# Patient Record
Sex: Male | Born: 1945 | Race: White | Hispanic: No | Marital: Married | Smoking: Former smoker
Health system: Southern US, Community
[De-identification: ages and names within clinical notes are randomized; demographics above are authoritative.]

## PROBLEM LIST (undated history)

## (undated) DIAGNOSIS — M1990 Unspecified osteoarthritis, unspecified site: Secondary | ICD-10-CM

## (undated) DIAGNOSIS — I2510 Atherosclerotic heart disease of native coronary artery without angina pectoris: Secondary | ICD-10-CM

## (undated) DIAGNOSIS — I10 Essential (primary) hypertension: Secondary | ICD-10-CM

## (undated) DIAGNOSIS — T7840XA Allergy, unspecified, initial encounter: Secondary | ICD-10-CM

## (undated) DIAGNOSIS — Z8719 Personal history of other diseases of the digestive system: Secondary | ICD-10-CM

## (undated) DIAGNOSIS — Z789 Other specified health status: Secondary | ICD-10-CM

## (undated) HISTORY — DX: Allergy, unspecified, initial encounter: T78.40XA

## (undated) HISTORY — PX: COLONOSCOPY: SHX174

## (undated) HISTORY — PX: KNEE ARTHROSCOPY: SHX127

## (undated) HISTORY — PX: TONSILLECTOMY: SUR1361

## (undated) HISTORY — PX: KNEE ARTHROPLASTY: SHX992

---

## 2002-10-05 ENCOUNTER — Encounter (HOSPITAL_COMMUNITY): Admission: RE | Admit: 2002-10-05 | Discharge: 2003-01-03 | Payer: Self-pay | Admitting: Otolaryngology

## 2002-10-13 ENCOUNTER — Encounter: Admission: RE | Admit: 2002-10-13 | Discharge: 2002-10-13 | Payer: Self-pay | Admitting: Otolaryngology

## 2002-10-13 ENCOUNTER — Encounter: Payer: Self-pay | Admitting: Otolaryngology

## 2002-10-16 ENCOUNTER — Observation Stay (HOSPITAL_COMMUNITY): Admission: EM | Admit: 2002-10-16 | Discharge: 2002-10-17 | Payer: Self-pay | Admitting: Emergency Medicine

## 2002-10-16 ENCOUNTER — Encounter: Payer: Self-pay | Admitting: Emergency Medicine

## 2002-10-29 ENCOUNTER — Ambulatory Visit (HOSPITAL_COMMUNITY): Admission: RE | Admit: 2002-10-29 | Discharge: 2002-10-29 | Payer: Self-pay | Admitting: Cardiology

## 2006-05-28 HISTORY — PX: JOINT REPLACEMENT: SHX530

## 2006-12-16 ENCOUNTER — Inpatient Hospital Stay (HOSPITAL_COMMUNITY): Admission: RE | Admit: 2006-12-16 | Discharge: 2006-12-19 | Payer: Self-pay | Admitting: Orthopedic Surgery

## 2007-03-10 ENCOUNTER — Encounter: Admission: RE | Admit: 2007-03-10 | Discharge: 2007-04-08 | Payer: Self-pay | Admitting: Family Medicine

## 2007-06-11 ENCOUNTER — Inpatient Hospital Stay (HOSPITAL_COMMUNITY): Admission: AD | Admit: 2007-06-11 | Discharge: 2007-06-13 | Payer: Self-pay | Admitting: Gastroenterology

## 2008-05-28 HISTORY — PX: JOINT REPLACEMENT: SHX530

## 2008-12-15 ENCOUNTER — Inpatient Hospital Stay (HOSPITAL_COMMUNITY): Admission: RE | Admit: 2008-12-15 | Discharge: 2008-12-17 | Payer: Self-pay | Admitting: Orthopedic Surgery

## 2009-08-26 HISTORY — PX: ORIF FINGER FRACTURE: SHX2122

## 2009-09-05 ENCOUNTER — Ambulatory Visit (HOSPITAL_BASED_OUTPATIENT_CLINIC_OR_DEPARTMENT_OTHER): Admission: RE | Admit: 2009-09-05 | Discharge: 2009-09-05 | Payer: Self-pay | Admitting: Orthopedic Surgery

## 2010-07-06 ENCOUNTER — Other Ambulatory Visit: Payer: Self-pay | Admitting: Otolaryngology

## 2010-07-10 ENCOUNTER — Ambulatory Visit
Admission: RE | Admit: 2010-07-10 | Discharge: 2010-07-10 | Disposition: A | Discharge status: Home / Self Care | Payer: 59 | Source: Ambulatory Visit | Attending: Otolaryngology | Admitting: Otolaryngology

## 2010-07-10 MED ORDER — IOHEXOL 300 MG/ML  SOLN
75.0000 mL | Freq: Once | INTRAMUSCULAR | Status: AC | PRN
  Administered 2010-07-10: 75 mL via INTRAVENOUS

## 2010-09-03 LAB — COMPREHENSIVE METABOLIC PANEL
Albumin: 4.4 g/dL (ref 3.5–5.2)
BUN: 17 mg/dL (ref 6–23)
Chloride: 103 mEq/L (ref 96–112)
Creatinine, Ser: 1.05 mg/dL (ref 0.4–1.5)
GFR calc non Af Amer: >60 mL/min (ref >60)
Total Bilirubin: 1 mg/dL (ref 0.3–1.2)

## 2010-09-03 LAB — BASIC METABOLIC PANEL
CO2: 27 mEq/L (ref 19–32)
Chloride: 102 mEq/L (ref 96–112)
GFR calc Af Amer: >60 mL/min (ref >60)
GFR calc non Af Amer: >60 mL/min (ref >60)
Glucose, Bld: 136 mg/dL — ABNORMAL HIGH (ref 70–99)
Potassium: 3.7 mEq/L (ref 3.5–5.1)
Potassium: 4.3 mEq/L (ref 3.5–5.1)
Sodium: 134 mEq/L — ABNORMAL LOW (ref 135–145)

## 2010-09-03 LAB — CBC
HCT: 26.2 % — ABNORMAL LOW (ref 39.0–52.0)
HCT: 31.8 % — ABNORMAL LOW (ref 39.0–52.0)
HCT: 41 % (ref 39.0–52.0)
Hemoglobin: 10.9 g/dL — ABNORMAL LOW (ref 13.0–17.0)
Hemoglobin: 9.2 g/dL — ABNORMAL LOW (ref 13.0–17.0)
MCHC: 35.2 g/dL (ref 30.0–36.0)
MCV: 98.5 fL (ref 78.0–100.0)
MCV: 97.1 fL (ref 78.0–100.0)
Platelets: 210 10*3/uL (ref 150–400)
RBC: 3.23 MIL/uL — ABNORMAL LOW (ref 4.22–5.81)
RDW: 13.4 % (ref 11.5–15.5)
WBC: 7.9 10*3/uL (ref 4.0–10.5)
WBC: 6.8 10*3/uL (ref 4.0–10.5)

## 2010-09-03 LAB — APTT: aPTT: 33 seconds (ref 24–37)

## 2010-09-03 LAB — URINALYSIS, ROUTINE W REFLEX MICROSCOPIC
Bilirubin Urine: NEGATIVE
Ketones, ur: NEGATIVE mg/dL
Nitrite: NEGATIVE
Protein, ur: NEGATIVE mg/dL
Specific Gravity, Urine: 1.022 (ref 1.005–1.030)
Urobilinogen, UA: 0.2 mg/dL (ref 0.0–1.0)

## 2010-09-03 LAB — TYPE AND SCREEN

## 2010-09-03 LAB — PROTIME-INR
INR: 1 (ref 0.00–1.49)
Prothrombin Time: 13.4 seconds (ref 11.6–15.2)

## 2010-09-26 HISTORY — PX: CARDIAC CATHETERIZATION: SHX172

## 2010-10-10 NOTE — Op Note (Signed)
NAMEDAN, SCEARCE NO.:  [id number]   MEDICAL RECORD NO.:  [id number]          PATIENT TYPE:  INP   LOCATION:  5009                         FACILITY:  [facility]   PHYSICIAN:  Loreta Ave, M.D. DATE OF BIRTH:  1945/06/03   DATE OF PROCEDURE:  DATE OF DISCHARGE:                               OPERATIVE REPORT   PREOPERATIVE DIAGNOSIS:  End-stage degenerative arthritis, left knee,  varus alignment; mild flexion contracture.   POSTOPERATIVE DIAGNOSIS:  End-stage degenerative arthritis, left knee,  varus alignment; mild flexion contracture.   PROCEDURE:  Left modified minimally invasive total knee replacement with  Stryker triathlon prosthesis.  Cemented pegged posterior stabilized #6  femoral component.  Cemented #7 tibial component with a 9-mm  polyethylene insert.  Resurfacing cemented, pegged medial offset 38-mm  patellar component.  Soft tissue balancing.   SURGEON:  Loreta Ave, MD   ASSISTANT:  Genene Churn. Barry Dienes, [place], present throughout the entire case, was  necessary for timely completion of the procedure.   ANESTHESIA:  General.   BLOOD LOSS:  Minimal.   SPECIMENS:  None.   CULTURES:  None.   COMPLICATIONS:  None.   DRESSING:  Sterile compressive knee immobilizer.   TOURNIQUET TIME:  1 hour and 15 minutes.   DRAIN:  Hemovac x1.   PROCEDURE IN DETAIL:  The patient was brought to the operating room,  placed on operating table in supine position.  After adequate anesthesia  had been obtained, knee examined.  Mild flexion contracture, varus  alignment of more than 5 degrees, not very correctable.  Stable  ligaments.  Good flexion.  Tourniquet applied.  Prepped and draped in  usual sterile fashion.  Exsanguinated with elevation of Esmarch.  Tourniquet inflated to 350 mmHg.  Straight incision above the patella  down the tibial tubercle.  Medial arthrotomy, vastus splitting,  preserving quad tendon.  Knee exposed.  Marked grade 4 changes  throughout.  Remnants of menisci, cruciate ligaments removed.  Numerous  small loose bodies and eventually one large 2-cm loose body removed.  Distal femur exposed.  Intramedullary guide placed.  Distal cut 10-mm,  75 degrees of valgus.  Using epicondylar axis, size cut and fitted for a  peg, posterior stabilized, #6 component.  Proximal tibia exposed.  3-  degree posterior slope cut, extramedullary guide.  Size #7 component.  Medial capsule release.  All recess examined.  All loose bodies, all  spurs removed.  Thoroughly irrigated.  Patella exposed, measured, cut,  drilled, and fitted with a 38-mm component.  Trials put in place  throughout, #6 on the femur, #7 on the tibia.  With a 9-mm insert, full  extension and full flexion.  Good alignment and good stability and good  patellofemoral tracking.  Tibia was marked for appropriate rotation and  reamed.  All trials removed.  Copious irrigation with a Pulse irrigating  device.  Cement prepared, placed on all components, which were firmly  seated.  Knee reduced.  Patellar component held in place with a clamp.  Once the cement hardened, reexamined.  Pleased with alignment,  stability, flexion, extension, and patellofemoral tracking.  Hemovac  placed and brought out through a separate stab wound.  Arthrotomy closed  with #1 Vicryl.  Skin and subcutaneous tissue with Vicryl and staples.  Knee injected with Marcaine.  Hemovac clamp.  Sterile compressive  dressing applied.  Tourniquet deflated and removed.  Knee immobilizer  applied.  Anesthesia reversed.  Brought to the recovery room.  He  tolerated the surgery well.  No complications.      Loreta Ave, M.D.  Electronically Signed     DFM/MEDQ  D:  12/15/2008  T:  12/16/2008  Job:  [id number]

## 2010-10-10 NOTE — Op Note (Signed)
NAMEDAMACIO, WEISGERBER NO.:  [id number]   MEDICAL RECORD NO.:  [id number]          PATIENT TYPE:  INP   LOCATION:  2550                         FACILITY:  [facility]   PHYSICIAN:  Loreta Ave, M.D. DATE OF BIRTH:  30-Nov-1945   DATE OF PROCEDURE:  12/16/2006  DATE OF DISCHARGE:                               OPERATIVE REPORT   PREOPERATIVE DIAGNOSES:  1. End-stage degenerative arthritis, right knee.  2. Varus alignment and flexion contracture.   POSTOPERATIVE DIAGNOSES:  1. End-stage degenerative arthritis, right knee.  2. Varus alignment and flexion contracture.   PROCEDURE:  Right knee total knee replacement utilizing Stryker  triathlon prosthesis.  Minimally invasive system.  Cemented pegged  posterior stabilized #6 femoral component.  Cemented #7 tibial component  with 13-mm posterior stabilized polyethylene insert.  Resurfacing 38 mm  x 10-mm patellar component cemented pegged medial offset.  Soft tissue  balancing and medial capsule release.   SURGEON:  Loreta Ave, M.D.   ASSISTANT:  Zonia Kief, PA present throughout the entire case.   ANESTHESIA:  General.   BLOOD LOSS:  Minimal.   SPECIMENS:  None.   CULTURES:  None.   COMPLICATIONS:  None.   DRESSING:  Soft compressive with knee immobilizer.   DRAIN:  Hemovac x1.   TOURNIQUET TIME:  1 hour and 20 minutes.   PROCEDURE:  The patient was brought to the operating room and placed on  the operating table in supine position.  After adequate anesthesia had  been obtained, the right knee examined.  A couple degree flexion  contracture, further flexion better than 100 degrees.  Varus alignment  correctable to neutral.  Tourniquet applied, prepped and draped in the  usual sterile fashion.  Exsanguinated with elevation of Esmarch,  tourniquet inflated to 350 mmHg.  A straight incision above the patella  down to the tibial tubercle.  Medial arthrotomy up to the superomedial  aspect of the  patella.  Vastus splitting from there for a minimally  invasive system.  Knee exposed.  Grade 4 changes throughout.  Periarticular spurs, remnants of menisci and cruciate ligaments, loose  bodies, hypertrophic synovial tissue debrided.  Distal femur exposed.  Intramedullary guide placed. Distal cut removing 10 mm set at 5 degrees  of valgus to mechanical axis.  Epicondylar axis marked.  Utilizing that  the femur was sized, cut and fitted for a posterior stabilized #6  component.  Excellent capture and alignment and fixation.  Attention  turned to the tibia.  Extramedullary guide.  Sufficient resection to get  a good perpendicular cut.  Some bony deficiency medially which brought  the cut down a little bit.  After the 3 degree posterior slope cut was  made, the size #7 component.  Trials put in place above and below.  A #6  on the femur, #7 on the tibia.  With the 13-mm insert, I had a nicely  balanced knee with full extension, full flexion, good alignment and good  stability.  Marked for rotation and the tibia was hand reamed.  The  patella prepared with a 10-mm posterior resection.  Revised to  11 mm  when I chose a 38-mm component.  predrilled and fitted with a 38-mm  component.  Full motion, good tracking patellofemoral joint. All trials  removed.  Copious irrigation with a pulse irrigating device.  Cement  prepared and placed on all components which were firmly seated.  Polyethylene attached to tibia.  The knee reduced.  Once the cement  hardened, it was reexamined.  Full extension, full flexion, good  alignment, good stability and good patellofemoral tracking.  Hemovac  placed and brought out through a separate stab wound.  Arthrotomy closed  with #1 Vicryl, skin and subcutaneous tissue with Vicryl and staples.  Knee injected with Marcaine.  Hemovac clamped.  Sterile compressive  dressing applied.  The tourniquet inflated and removed.  Knee  immobilizer applied.  Anesthesia reversed.   Brought to the recovery  room. He tolerated surgery well with no complications.      Loreta Ave, M.D.  Electronically Signed     DFM/MEDQ  D:  12/16/2006  T:  12/16/2006  Job:  [id number]

## 2010-10-13 NOTE — Discharge Summary (Signed)
NAME:  Kenneth Wagner, Kenneth Wagner                      ACCOUNT NO.:  [id number]   MEDICAL RECORD NO.:  [id number]                   PATIENT TYPE:  OBV   LOCATION:  5784                                 FACILITY:  [facility]   PHYSICIAN:  Jackie Plum, M.D.             DATE OF BIRTH:  [birth year]   DATE OF ADMISSION:  10/16/2002  DATE OF DISCHARGE:  10/17/2002                                 DISCHARGE SUMMARY   PRIMARY CARE PHYSICIAN:  Tesfaye D. Felecia Shelling, M.D., fax number [phone].   DISCHARGE DIAGNOSES:  1. Chest pain.  2. History of recent hearing loss.  3. Elevated liver enzymes.   DISCHARGE MEDICATIONS:  1. Aspirin 325 mg daily.  2. Lopressor 25 mg one-half tablet twice daily.   CONSULTATIONS:  None this hospitalization.   PROCEDURES AND STUDIES:  A 12-lead EKG, May 21, with normal sinus rhythm.   LABORATORY DATA:  Sodium 136, potassium 4.1, chloride 104, CO2 26, glucose  97, BUN 23, creatinine 1.1, calcium 8.2.  Total protein 64, albumin 3.9.  AST 43, ALT 50, alkaline phosphatase 98.  WBC 7.3, RBC 4.5, hemoglobin 14.5,  hematocrit 42.8.  CK 66, CK-MB 1.5, troponin 0.02, then 0.01.  Total  cholesterol 182, triglycerides 99, HDL 72, LDL 90.   DISPOSITION:  The patient was discharged home.   CONDITION ON DISCHARGE:  Stable.   HISTORY OF PRESENT ILLNESS:  This is a 65 year old man who presented to  [facility] Emergency Room on the morning of admission with complaints of  chest pain.  The patient said he awoke with chest pain that was described as  pressure-like in quality.  He has reported that it lasted approximately 15  minutes and was associated with nausea and diaphoresis.  He described the  pain as 3/10 as 3/10 in intensity; however, at the time of the evaluation  the pain had resolved.  The patient was concerned and came to the emergency  department because his father died of a sudden heart attack at the age of 18  and his brother has had some cardiac problems somewhere in  his late 29s.  His initial examination revealed stable vital signs.  The patient was pain  free, but due to his multiple risk factors including gender and age, he was  admitted for further evaluation and treatment.   HOSPITAL COURSE:  1. Chest Pain:  The patient was admitted to rule out MI.  Serial cardiac     enzymes were obtained along with some EKGs.  The patient had no further     complaints of pain during this hospitalization.  He was subsequently     discharged within 24 hours.  Prior to discharge, he had set up to return     on Monday morning for a treadmill stress test.   1. Recent Hearing Loss:  The patient is a Technical sales engineer .  There was no workup of     his recent hearing  loss.  This should be followed up by his primary care     physician.   1. Elevated Liver Enzymes:  Initial laboratory data reveals some slight     elevation in his AST and ALT.  No further workup was done due to the very     minimal elevation, but this should be followed up as an outpatient by his     primary care M.D.   DISCHARGE INSTRUCTIONS/FOLLOWUP:  The patient should follow up with his  primary care physician in 1-2 weeks and return in two days for the treadmill  stress test.         [name], NP                      Jackie Plum, M.D.    SJ/MEDQ  D:  10/17/2002  T:  10/17/2002  Job:  [id number]   cc:   Tesfaye D. Felecia Shelling, M.D.  [address]  [place]  [place] [address]  Fax: [phone]

## 2010-10-13 NOTE — H&P (Signed)
NAME:  Kenneth Wagner, Kenneth Wagner                      ACCOUNT NO.:  [id number]   MEDICAL RECORD NO.:  [id number]                   PATIENT TYPE:  EMS   LOCATION:  ED                                   FACILITY:  [facility]   PHYSICIAN:  Jackie Plum, M.D.             DATE OF BIRTH:  May 15, 1946   DATE OF ADMISSION:  10/16/2002  DATE OF DISCHARGE:                                HISTORY & PHYSICAL   PROBLEM LIST:  1. Chest pain, rule out myocardial infarction.  2. History of recent hearing loss.   CHIEF COMPLAINT:  Chest pain.   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  The pain was  described as pressure like in quality and with an 18 component.  It lasted  about 15 minutes.  Was associated with nausea and diaphoresis.  It was  described as 3/10 in intensity.  However, at time of evaluation pain had  resolved.  The patient was concerned and came to ED because his father died  of a sudden heart attack at age 57 years and his brother had cardiac  problems somewhere in his late 23s.  Subsequently, brother had a stent  placed.  No history of fever, chills, cough, sputum production, ankle  swelling, cough, or leg pain.  No history of dizziness, sore throat,  abdominal pain, dysuria, or frequency of micturition.  The patient denies  any prior history of GI problems including GERD.   MEDICATIONS:  He takes aspirin at home.   ALLERGIES:  IODINE.   SOCIAL HISTORY:  He drinks alcohol about three drinks a day.  Does not smoke  cigarettes or use illicit drugs.   PHYSICAL EXAMINATION:  VITAL SIGNS:  Blood pressure 130/82, pulse 73,  respiratory rate 20, temperature 98.2 degrees Fahrenheit, O2 saturation 99%.  GENERAL:  He was not in acute cardiopulmonary distress.  HEENT:  Not pale.  Not icteric.  NECK:  No JVD.  LUNGS:  Clear to auscultation.  CARDIAC:  Regular rate and rhythm without any gallops or  murmur.  ABDOMEN:  Unremarkable.  EXTREMITIES:  No cyanosis.  No edema.  CNS:  Alert and oriented x3.  No focality.   LABORATORIES:  EKG showed normal sinus rhythm at 78 beats per minute, no  obvious acute ST-T wave changes.   ASSESSMENT:  A 65 year old gentleman whose coronary disease risk factors  include his male sex and age.  He is 65 years of age.  He is concerned and  obviously anxious about this in view of prior cardiac problems in his  family.    PLAN OF CARE:  Keep patient in the hospital for observation.  Will get  cardiac panel to rule out MI.  Tomorrow is Saturday and if he rules out we  will schedule him for outpatient treadmill.  Jackie Plum, M.D.    GO/MEDQ  D:  10/16/2002  T:  10/16/2002  Job:  [id number]   cc:   [name] D. Felecia Shelling, M.D.  [address]  [place]  [place] [address]  Fax: [phone]

## 2010-10-13 NOTE — Discharge Summary (Signed)
Kenneth Wagner, SNELLGROVE NO.:  [id number]   MEDICAL RECORD NO.:  [id number]          PATIENT TYPE:  INP   LOCATION:  5010                         FACILITY:  [facility]   PHYSICIAN:  Loreta Ave, M.D. DATE OF BIRTH:  [birth year]   DATE OF ADMISSION:  12/16/2006  DATE OF DISCHARGE:  12/19/2006                               DISCHARGE SUMMARY   FINAL DIAGNOSIS:  Status post right knee replacement for end-stage  degenerative joint disease.   HISTORY OF PRESENT ILLNESS:  A 65 year old white male with a history of  end-stage DJD right knee and chronic pain who presented to our office  for preoperative evaluation for a total knee replacement. He had  progressively worsening pain with failure to respond to conservative  treatment. Significant decrease in his daily activities due to the  ongoing complaint.   HOSPITAL COURSE:  December 16, 2006, the patient was taken to the [facility]  OR and a right total knee replacement procedure performed. Surgeon was  Loreta Ave, M.D. and assistant Genene Churn. Barry Dienes, P.A.-C. Anesthesia  was general with femoral nerve block. No specimens. EBL minimal.  Tourniquet time 1 hour and 35 minutes. One Hemovac drain placed. There  were no surgical or anesthesia complications, and the patient was  transferred to the recovery room in stable condition. December 17, 2006, the  patient doing well with good pain control. Vital signs stable.  Hemoglobin 11.1, hematocrit 32.9. Sodium 135, potassium 3.7, CO2 23, CO2  28, BUN 6, creatinine 0.9, glucose 127. Dressing clean, dry and intact.  Neurovascularly intact. Calf nontender. Pharmacy protocol Coumadin  started. December 18, 2006, the patient doing well and progressing great  with PT. No specific complaints. Vital signs stable, afebrile.  Hemoglobin 10.8, hematocrit 31.4. Glucose 135. Wound looked good,  staples intact. Hemovac drain pulled. Calf nontender. Neurovascularly  intact. No signs of injection.  Discontinued PCA, Foley and O2. Saline  locked IV. December 19, 2006, the patient still doing well with therapy and  pain control. States that he is ready to go home. Temperature 100.9,  pulse 76, respirations 20, blood pressure 118/72. WBC 8.9, hematocrit  32.0, hemoglobin 10.7, platelets 179. Sodium 137, potassium 4.8,  chloride 98, CO2 32, BUN 4, creatinine 1.08, glucose 124. Wound looks  good. Staples intact. Calf nontender. Neurovascularly intact. No  drainage or signs of infection.   DISPOSITION:  Discharged home.   CONDITION:  Good and stable.   MEDICATIONS:  1. Percocet 7.5/325 one to two tablets p.o. q.4-6 hours p.r.n. for      pain.  2. Robaxin 500 mg 1 tablet p.o. q.6 hours p.r.n. for spasms.  3. Lovenox 30 mg 1 subcutaneous injection daily for 4 weeks      postoperatively.   INSTRUCTIONS:  The patient to work with home health PT and OT to improve  knee range of motion and strengthening and ambulation. Daily dressing  changes of 4 x 4 gauze and tape. He remain on Lovenox for 4 weeks  postoperatively for DVT prophylaxis. He will follow up in the office 2  weeks postoperatively for recheck and have possible  staple removal.  Return __________      Genene Churn. Denton Meek.      Loreta Ave, M.D.  Electronically Signed    JMO/MEDQ  D:  02/12/2007  T:  02/12/2007  Job:  [id number]

## 2010-10-13 NOTE — Cardiovascular Report (Signed)
NAME:  Kenneth Wagner, Kenneth Wagner                      ACCOUNT NO.:  [id number]   MEDICAL RECORD NO.:  [id number]                   PATIENT TYPE:  OIB   LOCATION:  2899                                 FACILITY:  [facility]   PHYSICIAN:  W. Ashley Royalty., M.D.         DATE OF BIRTH:  04/29/1946   DATE OF PROCEDURE:  DATE OF DISCHARGE:                              CARDIAC CATHETERIZATION   HISTORY:  A 65 year old male who has a prior family history of heart  disease.  He had difficulty with hearing loss in his left ear and had  recently undergone a treatment course of Renografin and Dextran as well as  prednisone.  He had a prolonged episode of chest discomfort lasting 15  minutes.  It sounded ischemic and has subsequently had an abnormal treadmill  test.   COMMENTS ABOUT THE PROCEDURE:  The patient tolerated the procedure well  without complications.  The procedure was done through 6-French catheters  through the right femoral artery percutaneously and at the end of the  procedure good hemostasis and peripheral pulses were present.   HEMODYNAMIC DATA:  1. Aorta post contrast 115/66.  2. LV post contrast 115/4-12.   ANGIOGRAPHIC DATA:  LEFT VENTRICULOGRAM:  Performed in the 30 degree RAO  projection.  The aortic valve was normal.  The mitral valve was normal.  The left ventricle appears normal in size.  The estimated ejection fraction is 65%.  There are no wall motion abnormalities.  Coronary arteries arise and distribute normally.  There is mild calcification noted in the proximal left anterior descending  system.  Left main coronary artery is normal.  Left anterior descending:  A large vessel which extends to the apex.  There  is mild calcification noted, but only scattered irregularities are noted  with no significant focal obstructive stenoses noted.  Circumflex coronary artery:  Intermediate branch arises, but has a mild 20%  stenosis at its ostium.  It gives off a first  marginal branch and is a  dominant vessel with a posterolateral branch and ends in the posterior  descending artery.  It is free of disease otherwise.  Right coronary artery is a small, nondominant vessel supplying mainly right  ventricular branches and appears free of disease.   IMPRESSION:  1. Very mild coronary artery disease with calcification noted in the     proximal left anterior descending and mild     irregularities in the vessel.  2. Normal left ventricular function.   RECOMMENDATIONS:  Reassurance at this time.  Continue risk factor  modification.                                               Darden Palmer., M.D.    WST/MEDQ  D:  10/29/2002  T:  10/29/2002  Job:  [id number]  cc:   Al Decant. Janey Greaser, M.D.  [address]  [place]  [place] [address]  Fax: [phone]

## 2010-10-13 NOTE — Discharge Summary (Signed)
NAMEMEGHAN, TIEMANN NO.:  [id number]   MEDICAL RECORD NO.:  [id number]          PATIENT TYPE:  INP   LOCATION:  1513                         FACILITY:  [facility]   PHYSICIAN:  James L. Malon Kindle., M.D.DATE OF BIRTH:  07/21/1945   DATE OF ADMISSION:  06/10/2007  DATE OF DISCHARGE:  06/13/2007                               DISCHARGE SUMMARY   ADMISSION DIAGNOSIS:  Post polypectomy bleed.   FINAL DIAGNOSIS:  Post polypectomy bleed.   PERTINENT HISTORY:  A 65 year old who had colonoscopy performed five  days prior to admission, several polyps removed.  He continued to take  aspirin despite being told to stop it, came in with bright red blood,  hemoglobin had dropped to 6.6.   HOSPITAL COURSE:  The patient was admitted, given several units of  blood.  He received a total of 3 units over his hospitalization.  His  hemoglobin gradually come up to 9.5 and he was stable.  His stools  became brown.  He was felt to be in satisfactory condition for  discharge.   DISPOSITION:  Patient is discharged home.  Will follow up in the office  with Dr. Randa Evens for a CBC in the next several days.           ______________________________  Llana Aliment Malon Kindle., M.D.     Waldron Session  D:  06/30/2007  T:  07/01/2007  Job:  [id number]

## 2011-02-14 LAB — CBC
HCT: 23 — ABNORMAL LOW
MCHC: 35.6
MCV: 92.3
Platelets: 149 — ABNORMAL LOW
RBC: 2.49 — ABNORMAL LOW

## 2011-02-14 LAB — COMPREHENSIVE METABOLIC PANEL
AST: 18
BUN: 8
CO2: 29
Calcium: 7.9 — ABNORMAL LOW
Creatinine, Ser: 0.96
GFR calc Af Amer: >60
GFR calc non Af Amer: >60
Total Bilirubin: 0.6

## 2011-02-14 LAB — CROSSMATCH

## 2011-02-14 LAB — PROTIME-INR: INR: 1.1

## 2011-02-14 LAB — APTT: aPTT: 30

## 2011-02-14 LAB — HEMOGLOBIN AND HEMATOCRIT, BLOOD
HCT: 18.8 — ABNORMAL LOW
HCT: 24.9 — ABNORMAL LOW
Hemoglobin: 8.7 — ABNORMAL LOW

## 2011-02-14 LAB — ABO/RH: ABO/RH(D): O POS

## 2011-02-15 LAB — HEMOGLOBIN AND HEMATOCRIT, BLOOD
HCT: 24.9 — ABNORMAL LOW
HCT: 26.5 — ABNORMAL LOW
HCT: 27.4 — ABNORMAL LOW
Hemoglobin: 8.7 — ABNORMAL LOW
Hemoglobin: 9.5 — ABNORMAL LOW

## 2011-03-12 LAB — CBC
HCT: 41
Hemoglobin: 10.7 — ABNORMAL LOW
Hemoglobin: 11.1 — ABNORMAL LOW
Hemoglobin: 13.9
MCHC: 33.5
MCV: 97.3
MCV: 97.5
Platelets: 149 — ABNORMAL LOW
Platelets: 179
RDW: 13.4
RDW: 13.8
RDW: 14.2 — ABNORMAL HIGH
WBC: 8.8

## 2011-03-12 LAB — BASIC METABOLIC PANEL
BUN: 4 — ABNORMAL LOW
BUN: 4 — ABNORMAL LOW
BUN: 6
CO2: 28
Calcium: 8 — ABNORMAL LOW
Calcium: 8.7
Chloride: 100
Creatinine, Ser: 0.86
Creatinine, Ser: 0.9
GFR calc non Af Amer: >60
Glucose, Bld: 124 — ABNORMAL HIGH
Glucose, Bld: 127 — ABNORMAL HIGH
Sodium: 137

## 2011-03-12 LAB — URINALYSIS, ROUTINE W REFLEX MICROSCOPIC
Bilirubin Urine: NEGATIVE
Hgb urine dipstick: NEGATIVE
Ketones, ur: NEGATIVE
Protein, ur: NEGATIVE
Urobilinogen, UA: 1

## 2011-03-12 LAB — COMPREHENSIVE METABOLIC PANEL
Alkaline Phosphatase: 46
BUN: 13
Chloride: 104
Creatinine, Ser: 1.05
Glucose, Bld: 81
Potassium: 4
Total Bilirubin: 1.1

## 2011-03-12 LAB — PROTIME-INR
INR: 1
INR: 1.1
INR: 1.2
Prothrombin Time: 13.6
Prothrombin Time: 14.7
Prothrombin Time: 15
Prothrombin Time: 15.7 — ABNORMAL HIGH

## 2011-03-12 LAB — ABO/RH: ABO/RH(D): O POS

## 2011-08-07 ENCOUNTER — Encounter (HOSPITAL_BASED_OUTPATIENT_CLINIC_OR_DEPARTMENT_OTHER): Payer: Self-pay | Admitting: *Deleted

## 2011-08-07 ENCOUNTER — Other Ambulatory Visit: Payer: Self-pay | Admitting: Orthopedic Surgery

## 2011-08-07 NOTE — Progress Notes (Signed)
No labs needed

## 2011-08-13 ENCOUNTER — Encounter (HOSPITAL_BASED_OUTPATIENT_CLINIC_OR_DEPARTMENT_OTHER)
Admission: RE | Disposition: A | Discharge status: Home / Self Care | Payer: Self-pay | Source: Ambulatory Visit | Attending: Orthopedic Surgery

## 2011-08-13 ENCOUNTER — Encounter (HOSPITAL_BASED_OUTPATIENT_CLINIC_OR_DEPARTMENT_OTHER): Payer: Self-pay | Admitting: Orthopedic Surgery

## 2011-08-13 ENCOUNTER — Ambulatory Visit (HOSPITAL_BASED_OUTPATIENT_CLINIC_OR_DEPARTMENT_OTHER)
Admission: RE | Admit: 2011-08-13 | Discharge: 2011-08-13 | Disposition: A | Discharge status: Home / Self Care | Payer: 59 | Source: Ambulatory Visit | Attending: Orthopedic Surgery | Admitting: Orthopedic Surgery

## 2011-08-13 ENCOUNTER — Encounter (HOSPITAL_BASED_OUTPATIENT_CLINIC_OR_DEPARTMENT_OTHER): Payer: Self-pay | Admitting: Anesthesiology

## 2011-08-13 ENCOUNTER — Ambulatory Visit (HOSPITAL_BASED_OUTPATIENT_CLINIC_OR_DEPARTMENT_OTHER): Payer: 59 | Admitting: Anesthesiology

## 2011-08-13 DIAGNOSIS — S60459A Superficial foreign body of unspecified finger, initial encounter: Secondary | ICD-10-CM | POA: Insufficient documentation

## 2011-08-13 DIAGNOSIS — G5600 Carpal tunnel syndrome, unspecified upper limb: Secondary | ICD-10-CM | POA: Insufficient documentation

## 2011-08-13 HISTORY — DX: Other specified health status: Z78.9

## 2011-08-13 HISTORY — PX: FOREIGN BODY REMOVAL: SHX962

## 2011-08-13 HISTORY — DX: Personal history of other diseases of the digestive system: Z87.19

## 2011-08-13 HISTORY — PX: CARPAL TUNNEL RELEASE: SHX101

## 2011-08-13 HISTORY — DX: Unspecified osteoarthritis, unspecified site: M19.90

## 2011-08-13 SURGERY — CARPAL TUNNEL RELEASE
Anesthesia: General | Site: Hand | Laterality: Left | Wound class: Clean

## 2011-08-13 MED ORDER — MIDAZOLAM HCL 5 MG/5ML IJ SOLN
INTRAMUSCULAR | Status: DC | PRN
  Administered 2011-08-13: 2 mg via INTRAVENOUS

## 2011-08-13 MED ORDER — LIDOCAINE HCL (CARDIAC) 20 MG/ML IV SOLN
INTRAVENOUS | Status: DC | PRN
  Administered 2011-08-13: 80 mg via INTRAVENOUS

## 2011-08-13 MED ORDER — CHLORHEXIDINE GLUCONATE 4 % EX LIQD: 60.0000 mL | Freq: Once | CUTANEOUS | Status: DC

## 2011-08-13 MED ORDER — HYDROCODONE-ACETAMINOPHEN 5-500 MG PO TABS: 1.0000 | ORAL_TABLET | ORAL | Status: AC | PRN

## 2011-08-13 MED ORDER — LORAZEPAM 2 MG/ML IJ SOLN: 1.0000 mg | Freq: Once | INTRAMUSCULAR | Status: DC | PRN

## 2011-08-13 MED ORDER — BUPIVACAINE HCL (PF) 0.25 % IJ SOLN
INTRAMUSCULAR | Status: DC | PRN
  Administered 2011-08-13: 9 mL

## 2011-08-13 MED ORDER — HYDROMORPHONE HCL PF 1 MG/ML IJ SOLN: 0.2500 mg | INTRAMUSCULAR | Status: DC | PRN

## 2011-08-13 MED ORDER — MIDAZOLAM HCL 2 MG/2ML IJ SOLN: 1.0000 mg | INTRAMUSCULAR | Status: DC | PRN

## 2011-08-13 MED ORDER — FENTANYL CITRATE 0.05 MG/ML IJ SOLN: 50.0000 ug | INTRAMUSCULAR | Status: DC | PRN

## 2011-08-13 MED ORDER — LACTATED RINGERS IV SOLN
INTRAVENOUS | Status: DC
  Administered 2011-08-13: 12:00:00 via INTRAVENOUS

## 2011-08-13 MED ORDER — ONDANSETRON HCL 4 MG/2ML IJ SOLN
INTRAMUSCULAR | Status: DC | PRN
  Administered 2011-08-13: 4 mg via INTRAVENOUS

## 2011-08-13 MED ORDER — FENTANYL CITRATE 0.05 MG/ML IJ SOLN
INTRAMUSCULAR | Status: DC | PRN
  Administered 2011-08-13 (×2): 50 ug via INTRAVENOUS

## 2011-08-13 MED ORDER — DEXAMETHASONE SODIUM PHOSPHATE 10 MG/ML IJ SOLN
INTRAMUSCULAR | Status: DC | PRN
  Administered 2011-08-13: 10 mg via INTRAVENOUS

## 2011-08-13 MED ORDER — LACTATED RINGERS IV SOLN
INTRAVENOUS | Status: DC | PRN
  Administered 2011-08-13 (×2): via INTRAVENOUS

## 2011-08-13 MED ORDER — CEFAZOLIN SODIUM 1-5 GM-% IV SOLN: 1.0000 g | INTRAVENOUS | Status: DC

## 2011-08-13 MED ORDER — PROPOFOL 10 MG/ML IV EMUL
INTRAVENOUS | Status: DC | PRN
  Administered 2011-08-13: 250 mg via INTRAVENOUS

## 2011-08-13 SURGICAL SUPPLY — 42 items
BANDAGE GAUZE ELAST BULKY 4 IN (GAUZE/BANDAGES/DRESSINGS) ×2 IMPLANT
BLADE MINI RND TIP GREEN BEAV (BLADE) IMPLANT
BLADE SURG 15 STRL LF DISP TIS (BLADE) ×1 IMPLANT
BLADE SURG 15 STRL SS (BLADE) ×1
BNDG COHESIVE 1X5 TAN STRL LF (GAUZE/BANDAGES/DRESSINGS) IMPLANT
BNDG COHESIVE 3X5 TAN STRL LF (GAUZE/BANDAGES/DRESSINGS) ×2 IMPLANT
BNDG ESMARK 4X9 LF (GAUZE/BANDAGES/DRESSINGS) ×2 IMPLANT
CHLORAPREP W/TINT 26ML (MISCELLANEOUS) ×2 IMPLANT
CLOTH BEACON ORANGE TIMEOUT ST (SAFETY) ×2 IMPLANT
CORDS BIPOLAR (ELECTRODE) ×2 IMPLANT
COVER MAYO STAND STRL (DRAPES) ×2 IMPLANT
COVER TABLE BACK 60X90 (DRAPES) ×2 IMPLANT
CUFF TOURNIQUET SINGLE 18IN (TOURNIQUET CUFF) ×2 IMPLANT
DRAPE EXTREMITY T 121X128X90 (DRAPE) ×2 IMPLANT
DRAPE SURG 17X23 STRL (DRAPES) ×2 IMPLANT
DRSG KUZMA FLUFF (GAUZE/BANDAGES/DRESSINGS) ×2 IMPLANT
GAUZE XEROFORM 1X8 LF (GAUZE/BANDAGES/DRESSINGS) ×2 IMPLANT
GLOVE BIO SURGEON STRL SZ 6.5 (GLOVE) IMPLANT
GLOVE BIOGEL PI IND STRL 8 (GLOVE) ×1 IMPLANT
GLOVE BIOGEL PI INDICATOR 8 (GLOVE) ×1
GLOVE SURG ORTHO 8.0 STRL STRW (GLOVE) ×2 IMPLANT
GLOVE SURG SS PI 8.0 STRL IVOR (GLOVE) ×2 IMPLANT
GOWN BRE IMP PREV XXLGXLNG (GOWN DISPOSABLE) ×4 IMPLANT
GOWN PREVENTION PLUS XLARGE (GOWN DISPOSABLE) IMPLANT
NEEDLE 27GAX1X1/2 (NEEDLE) ×2 IMPLANT
NS IRRIG 1000ML POUR BTL (IV SOLUTION) ×2 IMPLANT
PACK BASIN DAY SURGERY FS (CUSTOM PROCEDURE TRAY) ×2 IMPLANT
PAD CAST 3X4 CTTN HI CHSV (CAST SUPPLIES) ×1 IMPLANT
PADDING CAST ABS 4INX4YD NS (CAST SUPPLIES)
PADDING CAST ABS COTTON 4X4 ST (CAST SUPPLIES) IMPLANT
PADDING CAST COTTON 3X4 STRL (CAST SUPPLIES) ×1
SPONGE GAUZE 4X4 12PLY (GAUZE/BANDAGES/DRESSINGS) ×2 IMPLANT
STOCKINETTE 4X48 STRL (DRAPES) ×2 IMPLANT
SUT VIC AB 4-0 P2 18 (SUTURE) IMPLANT
SUT VICRYL 4-0 PS2 18IN ABS (SUTURE) IMPLANT
SUT VICRYL RAPID 5 0 P 3 (SUTURE) IMPLANT
SUT VICRYL RAPIDE 4/0 PS 2 (SUTURE) ×4 IMPLANT
SYR BULB 3OZ (MISCELLANEOUS) ×2 IMPLANT
SYR CONTROL 10ML LL (SYRINGE) ×2 IMPLANT
TOWEL OR 17X24 6PK STRL BLUE (TOWEL DISPOSABLE) ×4 IMPLANT
UNDERPAD 30X30 INCONTINENT (UNDERPADS AND DIAPERS) ×2 IMPLANT
WATER STERILE IRR 1000ML POUR (IV SOLUTION) IMPLANT
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
## 2011-08-13 NOTE — Discharge Instructions (Addendum)
[facility] Instructions Hand Surgery  Wound Care: Keep your hand elevated above the level of your heart.  Do not allow it to dangle  by your side.  Keep the dressing dry and do not remove it unless your doctor advises you to do so.  He will usually change it at the time of your post-op visit.  Moving your fingers is advised to stimulate circulation but will depend on the site of your surgery.  If you have a splint applied, your doctor will advise you regarding movement.  Activity: Do not drive or operate machinery today.  Rest today and then you may return to your normal activity and work as indicated by your physician.  Diet:  Drink liquids today or eat a light diet.  You may resume a regular diet tomorrow.    General expectations: Pain for two to three days. Fingers may become slightly swollen.  Call your doctor if any of the following occur: Severe pain not relieved by pain medication. Elevated temperature. Dressing soaked with blood. Inability to move fingers. White or bluish color to fingers.[facility]  [address] [place], [place] [address] [phone]   Post Anesthesia Home Care Instructions  Activity: Get plenty of rest for the remainder of the day. A responsible adult should stay with you for 24 hours following the procedure.  For the next 24 hours, DO NOT: -Drive a car -Operate machinery -Drink alcoholic beverages -Take any medication unless instructed by your physician -Make any legal decisions or sign important papers.  Meals: Start with liquid foods such as gelatin or soup. Progress to regular foods as tolerated. Avoid greasy, spicy, heavy foods. If nausea and/or vomiting occur, drink only clear liquids until the nausea and/or vomiting subsides. Call your physician if vomiting continues.  Special Instructions/Symptoms: Your throat may feel dry or sore from the anesthesia or the breathing tube placed in your throat during surgery. If  this causes discomfort, gargle with warm salt water. The discomfort should disappear within 24 hours.   

## 2011-08-13 NOTE — Anesthesia Procedure Notes (Signed)
Procedure Name: LMA Insertion Date/Time: 08/13/2011 1:55 PM Performed by: Burna Cash Pre-anesthesia Checklist: Patient identified, Emergency Drugs available, Suction available and Patient being monitored Patient Re-evaluated:Patient Re-evaluated prior to inductionOxygen Delivery Method: Circle System Utilized Preoxygenation: Pre-oxygenation with 100% oxygen Intubation Type: IV induction Ventilation: Mask ventilation without difficulty LMA: LMA inserted LMA Size: 5.0 Number of attempts: 1 Airway Equipment and Method: bite block Placement Confirmation: positive ETCO2 Tube secured with: Tape Dental Injury: Teeth and Oropharynx as per pre-operative assessment

## 2011-08-13 NOTE — Anesthesia Preprocedure Evaluation (Signed)
Anesthesia Evaluation  Patient identified by MRN, date of birth, ID band Patient awake    Reviewed: Allergy & Precautions, H&P , NPO status , Patient's Chart, lab work & pertinent test results  Airway Mallampati: I TM Distance: >3 FB Neck ROM: Full    Dental   Pulmonary    Pulmonary exam normal       Cardiovascular     Neuro/Psych    GI/Hepatic hiatal hernia,   Endo/Other    Renal/GU      Musculoskeletal  (+) Arthritis -,   Abdominal   Peds  Hematology   Anesthesia Other Findings   Reproductive/Obstetrics                           Anesthesia Physical Anesthesia Plan  ASA: II  Anesthesia Plan: General   Post-op Pain Management:    Induction: Intravenous  Airway Management Planned: LMA  Additional Equipment:   Intra-op Plan:   Post-operative Plan: Extubation in OR  Informed Consent: I have reviewed the patients History and Physical, chart, labs and discussed the procedure including the risks, benefits and alternatives for the proposed anesthesia with the patient or authorized representative who has indicated his/her understanding and acceptance.     Plan Discussed with: CRNA and Surgeon  Anesthesia Plan Comments:         Anesthesia Quick Evaluation

## 2011-08-13 NOTE — Transfer of Care (Signed)
Immediate Anesthesia Transfer of Care Note  Patient: Kenneth Wagner  Procedure(s) Performed: Procedure(s) (LRB): CARPAL TUNNEL RELEASE (Left) FOREIGN BODY REMOVAL ADULT (Left)  Patient Location: PACU  Anesthesia Type: General  Level of Consciousness: awake and alert   Airway & Oxygen Therapy: Patient Spontanous Breathing and Patient connected to face mask oxygen  Post-op Assessment: Report given to PACU RN and Post -op Vital signs reviewed and stable  Post vital signs: Reviewed and stable  Complications: No apparent anesthesia complications

## 2011-08-13 NOTE — Anesthesia Postprocedure Evaluation (Signed)
  Anesthesia Post-op Note  Patient: Kenneth Wagner  Procedure(s) Performed: Procedure(s) (LRB): CARPAL TUNNEL RELEASE (Left) FOREIGN BODY REMOVAL ADULT (Left)  Patient Location: PACU  Anesthesia Type: General  Level of Consciousness: awake and alert   Airway and Oxygen Therapy: Patient Spontanous Breathing  Post-op Pain: mild  Post-op Assessment: Post-op Vital signs reviewed, Patient's Cardiovascular Status Stable, Respiratory Function Stable, Patent Airway, No signs of Nausea or vomiting, Adequate PO intake and Pain level controlled  Post-op Vital Signs: stable  Complications: No apparent anesthesia complications

## 2011-08-13 NOTE — Brief Op Note (Signed)
08/13/2011  2:47 PM  PATIENT:  Kenneth Wagner  66 y.o. male  PRE-OPERATIVE DIAGNOSIS:  left cts and foreign body left index finger  POST-OPERATIVE DIAGNOSIS:  left cts and foreign body left index finger  PROCEDURE:  Procedure(s) (LRB): CARPAL TUNNEL RELEASE (Left) FOREIGN BODY REMOVAL ADULT (Left)  SURGEON:  Surgeon(s) and Role:    * Nicki Reaper, MD - Primary  PHYSICIAN ASSISTANT:   ASSISTANTS: none   ANESTHESIA:   local and general  EBL:  Total I/O In: 1300 [I.V.:1300] Out: -   BLOOD ADMINISTERED:none  DRAINS: none   LOCAL MEDICATIONS USED:  MARCAINE     SPECIMEN:  Excision  DISPOSITION OF SPECIMEN:  PATHOLOGY  COUNTS:  YES  TOURNIQUET:   Total Tourniquet Time Documented: Upper Arm (Left) - 36 minutes  DICTATION: .Other Dictation: Dictation Number [id number]  PLAN OF CARE: Discharge to home after PACU  PATIENT DISPOSITION:  PACU - hemodynamically stable.

## 2011-08-13 NOTE — Op Note (Signed)
Dictated number: [id number]

## 2011-08-14 LAB — POCT HEMOGLOBIN-HEMACUE: Hemoglobin: 13.1 g/dL (ref 13.0–17.0)
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
## 2011-08-16 ENCOUNTER — Encounter (HOSPITAL_BASED_OUTPATIENT_CLINIC_OR_DEPARTMENT_OTHER): Payer: Self-pay | Admitting: Orthopedic Surgery

## 2013-02-07 IMAGING — CT CT TEMPORAL BONES W/ CM
4 of 6 series · 12 of 30 positions shown, 13 images · IV contrast (75CC OMNI 300)
Comparison: None.

CLINICAL DATA: 64-year-old male with hearing loss in the left ear.
Question inner ear infection.

CT TEMPORAL BONES WITH CONTRAST
TECHNIQUE: Axial and coronal plane CT imaging of the petrous
temporal bones was performed with thin-collimation image
reconstruction after intravenous contrast administration.
Multiplanar CT image reconstructions were also generated.
Contrast: 75 ml [id number]-Q77.

[Series 3: ax mag right · axial · 0.19mm/px · z∈[-24,+1]mm · 3 of 80 slices shown]
[im 20/80  bone]
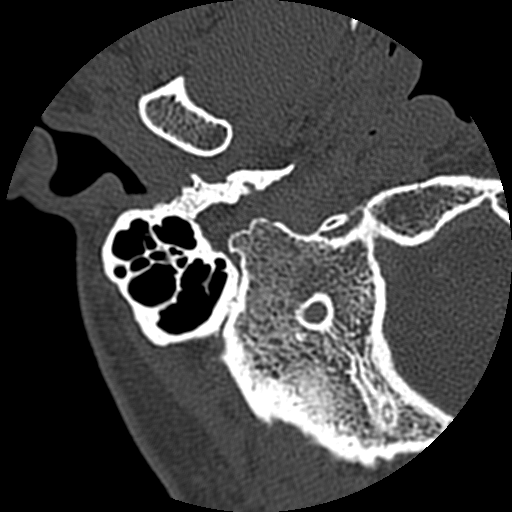
[im 40/80  bone]
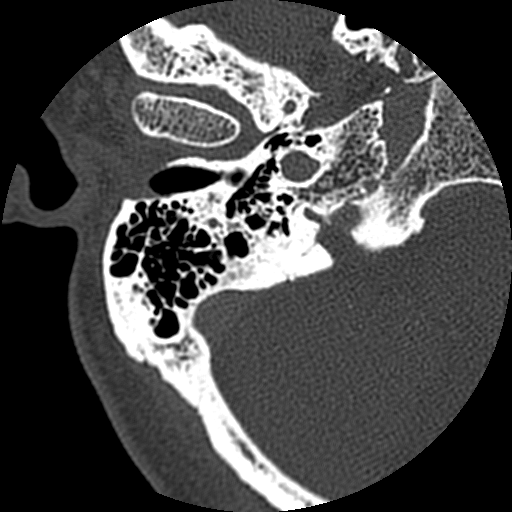
[im 60/80  bone]
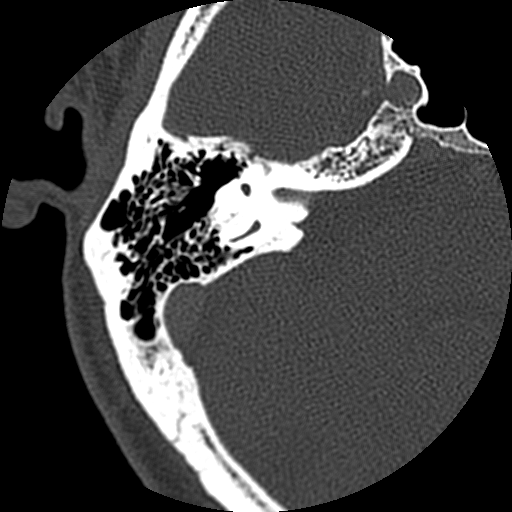

[Series 6: supine coronal bone · axial · 0.31mm/px · z∈[-79,-45]mm · 3 of 96 slices shown, 4 images]
[im 24/96  brain]
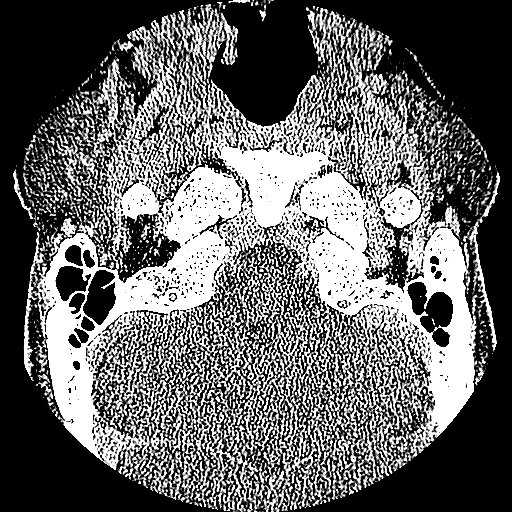
[im 24/96  bone]
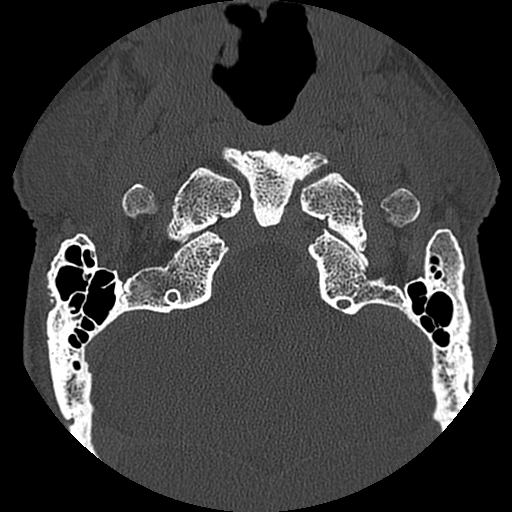
[im 48/96  bone]
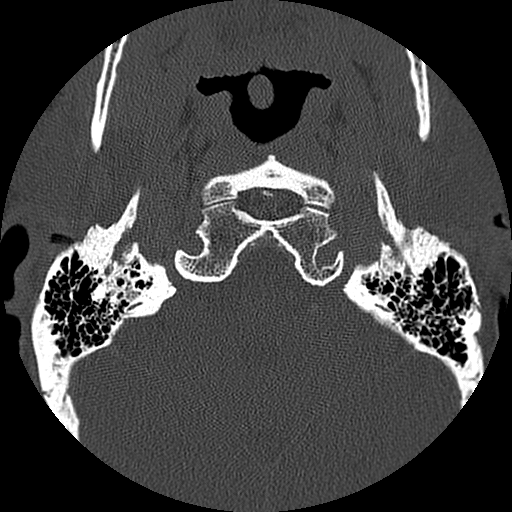
[im 72/96  bone]
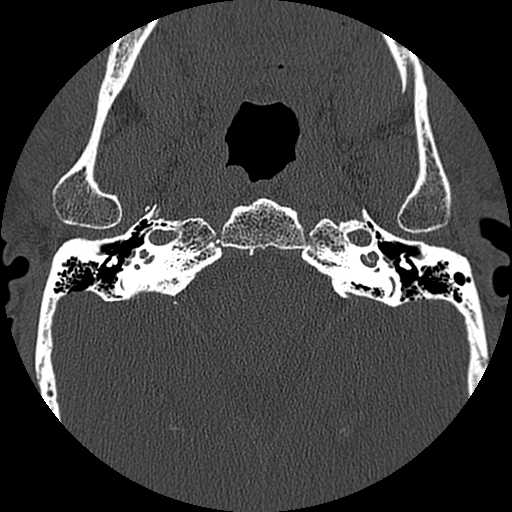

[Series 7: sup cor rt · axial · 0.19mm/px · z∈[-63,-29]mm · 3 of 96 slices shown]
[im 24/96  bone]
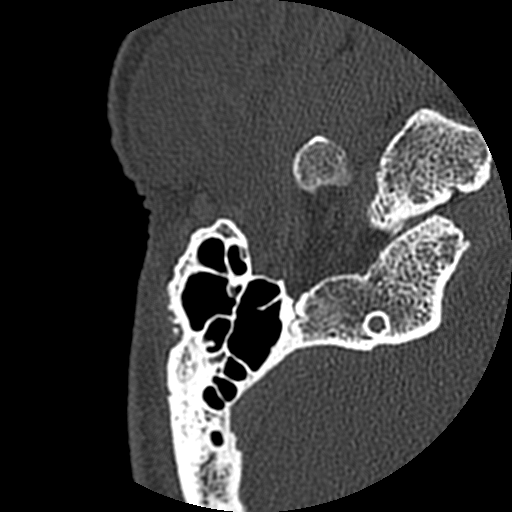
[im 48/96  bone]
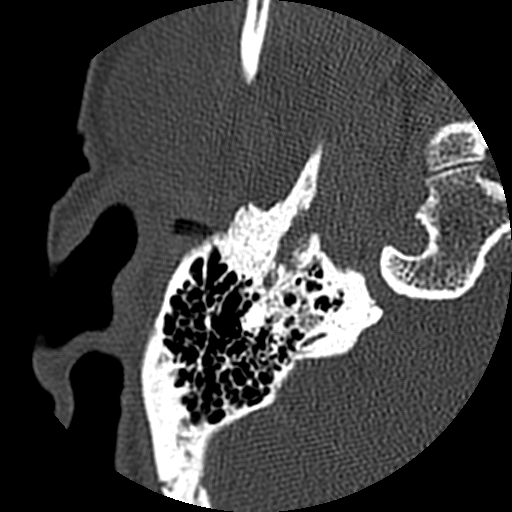
[im 72/96  bone]
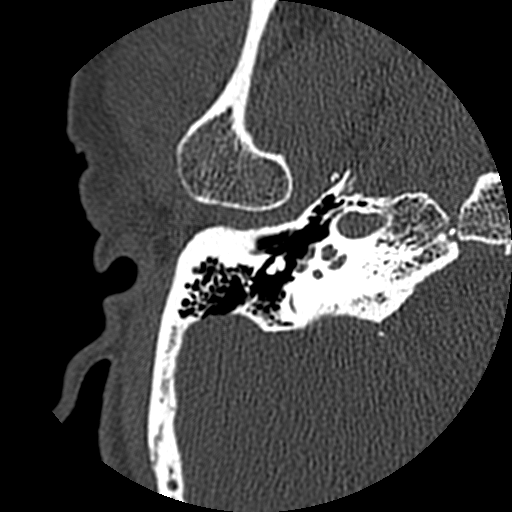

[Series 8: sup cor lt · axial · 0.19mm/px · z∈[-63,-29]mm · 3 of 96 slices shown]
[im 24/96  bone]
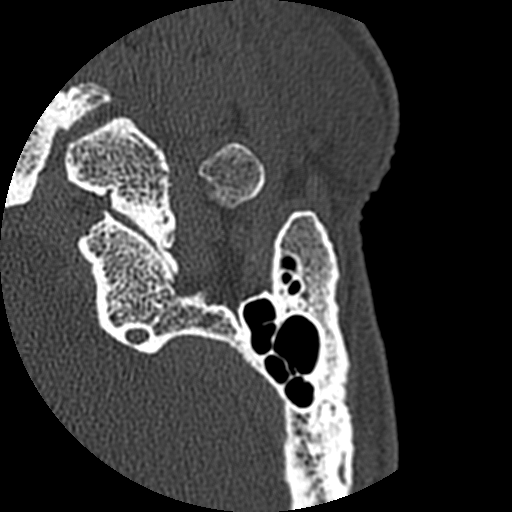
[im 48/96  bone]
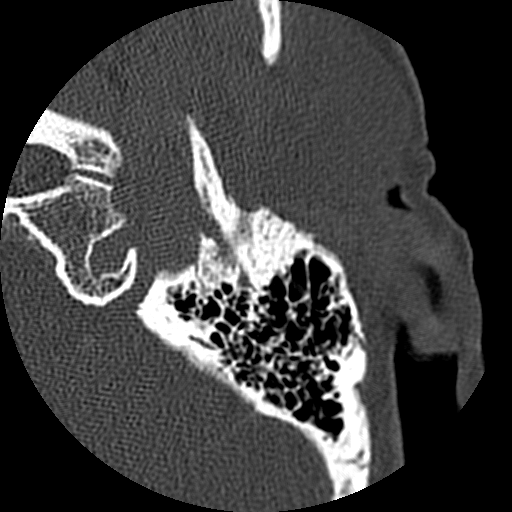
[im 72/96  bone]
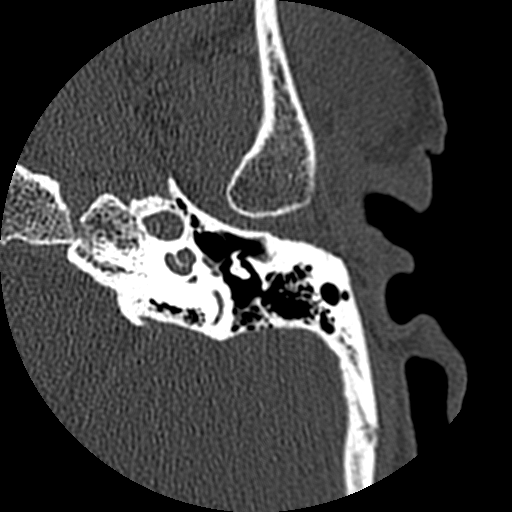

[12 of 30 positions shown; findings below may reference images not displayed]

FINDINGS: Visualized brain parenchyma within normal limits for age.
Calcified atherosclerosis at the skull base.  Visualized major
vascular structures are patent.  Visualized paranasal sinuses are
clear.

Right temporal bone:  The normal external auditory canal, tympanic
membrane, tympanic cavity, ossicles, mastoids, IAC, cochlea,
vestibule, semicircular canals, vestibular aqueduct, and course of
the right seventh nerve.

Left temporal bone:  Normal  left EAC.  Left tympanic membrane,
ossicles, and tympanic cavity are within normal limits.  Left
mastoids are clear.  Left IAC, cochlea, vestibule, semicircular
canals, vestibular aqueduct, and course of the left seventh nerve
are within normal limits.
IMPRESSION: Negative temporal bones.  No acute findings.

## 2015-01-07 ENCOUNTER — Ambulatory Visit (INDEPENDENT_AMBULATORY_CARE_PROVIDER_SITE_OTHER): Payer: 59 | Admitting: Internal Medicine

## 2015-01-07 VITALS — BP 112/74 | HR 50 | Temp 97.7°F | Resp 14 | Ht 72.0 in | Wt 192.0 lb

## 2015-01-07 DIAGNOSIS — S61259A Open bite of unspecified finger without damage to nail, initial encounter: Secondary | ICD-10-CM
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
## 2015-08-17 ENCOUNTER — Other Ambulatory Visit: Payer: Self-pay | Admitting: Gastroenterology

## 2015-12-06 DIAGNOSIS — M170 Bilateral primary osteoarthritis of knee: Secondary | ICD-10-CM | POA: Diagnosis not present

## 2015-12-13 DIAGNOSIS — M65871 Other synovitis and tenosynovitis, right ankle and foot: Secondary | ICD-10-CM | POA: Diagnosis not present

## 2015-12-13 DIAGNOSIS — G5761 Lesion of plantar nerve, right lower limb: Secondary | ICD-10-CM | POA: Diagnosis not present

## 2015-12-13 DIAGNOSIS — M792 Neuralgia and neuritis, unspecified: Secondary | ICD-10-CM | POA: Diagnosis not present

## 2015-12-13 DIAGNOSIS — M722 Plantar fascial fibromatosis: Secondary | ICD-10-CM | POA: Diagnosis not present

## 2016-02-03 DIAGNOSIS — Z125 Encounter for screening for malignant neoplasm of prostate: Secondary | ICD-10-CM | POA: Diagnosis not present

## 2016-02-03 DIAGNOSIS — R7301 Impaired fasting glucose: Secondary | ICD-10-CM | POA: Diagnosis not present

## 2016-02-03 DIAGNOSIS — Z136 Encounter for screening for cardiovascular disorders: Secondary | ICD-10-CM | POA: Diagnosis not present

## 2016-02-15 DIAGNOSIS — H40013 Open angle with borderline findings, low risk, bilateral: Secondary | ICD-10-CM | POA: Diagnosis not present

## 2016-02-15 DIAGNOSIS — H40059 Ocular hypertension, unspecified eye: Secondary | ICD-10-CM | POA: Diagnosis not present

## 2016-02-15 DIAGNOSIS — H40019 Open angle with borderline findings, low risk, unspecified eye: Secondary | ICD-10-CM | POA: Diagnosis not present

## 2016-02-15 DIAGNOSIS — H52223 Regular astigmatism, bilateral: Secondary | ICD-10-CM | POA: Diagnosis not present

## 2016-02-15 DIAGNOSIS — H5203 Hypermetropia, bilateral: Secondary | ICD-10-CM | POA: Diagnosis not present

## 2016-02-15 DIAGNOSIS — H25813 Combined forms of age-related cataract, bilateral: Secondary | ICD-10-CM | POA: Diagnosis not present

## 2016-02-22 DIAGNOSIS — H7312 Chronic myringitis, left ear: Secondary | ICD-10-CM | POA: Diagnosis not present

## 2016-02-22 DIAGNOSIS — H6123 Impacted cerumen, bilateral: Secondary | ICD-10-CM | POA: Diagnosis not present

## 2016-02-22 DIAGNOSIS — H6062 Unspecified chronic otitis externa, left ear: Secondary | ICD-10-CM | POA: Diagnosis not present

## 2016-02-22 DIAGNOSIS — H903 Sensorineural hearing loss, bilateral: Secondary | ICD-10-CM | POA: Diagnosis not present

## 2016-03-14 ENCOUNTER — Other Ambulatory Visit: Payer: Self-pay | Admitting: Family Medicine

## 2016-03-14 DIAGNOSIS — Z136 Encounter for screening for cardiovascular disorders: Secondary | ICD-10-CM

## 2016-03-14 DIAGNOSIS — Z0000 Encounter for general adult medical examination without abnormal findings: Secondary | ICD-10-CM | POA: Diagnosis not present

## 2016-03-14 DIAGNOSIS — Z23 Encounter for immunization: Secondary | ICD-10-CM | POA: Diagnosis not present

## 2016-03-26 DIAGNOSIS — H903 Sensorineural hearing loss, bilateral: Secondary | ICD-10-CM | POA: Diagnosis not present

## 2016-03-26 DIAGNOSIS — H6062 Unspecified chronic otitis externa, left ear: Secondary | ICD-10-CM | POA: Diagnosis not present

## 2016-03-26 DIAGNOSIS — H6123 Impacted cerumen, bilateral: Secondary | ICD-10-CM | POA: Diagnosis not present

## 2016-03-26 DIAGNOSIS — H7312 Chronic myringitis, left ear: Secondary | ICD-10-CM | POA: Diagnosis not present

## 2016-04-13 ENCOUNTER — Ambulatory Visit: Payer: Self-pay

## 2016-04-17 ENCOUNTER — Ambulatory Visit: Payer: Self-pay

## 2016-04-18 DIAGNOSIS — H7312 Chronic myringitis, left ear: Secondary | ICD-10-CM | POA: Diagnosis not present

## 2016-04-18 DIAGNOSIS — H903 Sensorineural hearing loss, bilateral: Secondary | ICD-10-CM | POA: Diagnosis not present

## 2016-04-18 DIAGNOSIS — H6123 Impacted cerumen, bilateral: Secondary | ICD-10-CM | POA: Diagnosis not present

## 2016-04-18 DIAGNOSIS — H6062 Unspecified chronic otitis externa, left ear: Secondary | ICD-10-CM | POA: Diagnosis not present

## 2016-04-27 ENCOUNTER — Ambulatory Visit
Admission: RE | Admit: 2016-04-27 | Discharge: 2016-04-27 | Disposition: A | Discharge status: Home / Self Care | Payer: PPO | Source: Ambulatory Visit | Attending: Family Medicine | Admitting: Family Medicine

## 2016-04-27 DIAGNOSIS — Z136 Encounter for screening for cardiovascular disorders: Secondary | ICD-10-CM | POA: Diagnosis not present

## 2016-04-27 DIAGNOSIS — Z87891 Personal history of nicotine dependence: Secondary | ICD-10-CM | POA: Diagnosis not present

## 2016-05-30 DIAGNOSIS — L821 Other seborrheic keratosis: Secondary | ICD-10-CM | POA: Diagnosis not present

## 2016-05-30 DIAGNOSIS — Z1283 Encounter for screening for malignant neoplasm of skin: Secondary | ICD-10-CM | POA: Diagnosis not present

## 2016-05-30 DIAGNOSIS — D225 Melanocytic nevi of trunk: Secondary | ICD-10-CM | POA: Diagnosis not present

## 2016-06-12 DIAGNOSIS — M25712 Osteophyte, left shoulder: Secondary | ICD-10-CM | POA: Diagnosis not present

## 2016-06-12 DIAGNOSIS — M19012 Primary osteoarthritis, left shoulder: Secondary | ICD-10-CM | POA: Diagnosis not present

## 2016-06-12 DIAGNOSIS — M25512 Pain in left shoulder: Secondary | ICD-10-CM | POA: Diagnosis not present

## 2016-07-27 DIAGNOSIS — M25551 Pain in right hip: Secondary | ICD-10-CM | POA: Diagnosis not present

## 2016-07-31 ENCOUNTER — Encounter (INDEPENDENT_AMBULATORY_CARE_PROVIDER_SITE_OTHER): Payer: Self-pay | Admitting: Orthopaedic Surgery

## 2016-07-31 ENCOUNTER — Ambulatory Visit (INDEPENDENT_AMBULATORY_CARE_PROVIDER_SITE_OTHER): Payer: PPO | Admitting: Orthopaedic Surgery

## 2016-07-31 ENCOUNTER — Ambulatory Visit (INDEPENDENT_AMBULATORY_CARE_PROVIDER_SITE_OTHER): Payer: PPO

## 2016-07-31 VITALS — Ht 72.0 in | Wt 195.0 lb

## 2016-07-31 DIAGNOSIS — M7541 Impingement syndrome of right shoulder: Secondary | ICD-10-CM | POA: Diagnosis not present

## 2016-07-31 DIAGNOSIS — M7542 Impingement syndrome of left shoulder: Secondary | ICD-10-CM

## 2016-07-31 MED ORDER — DICLOFENAC SODIUM 2 % TD SOLN: 2.0000 g | Freq: Two times a day (BID) | TRANSDERMAL | 3 refills | Status: DC | PRN

## 2016-07-31 NOTE — Progress Notes (Signed)
Office Visit Note   Patient: Kenneth Wagner           Date of Birth: 05-16-1946           MRN: [id number] Visit Date: 07/31/2016              Requested by: No referring provider defined for this encounter. PCP: Aretta Nip, MD   Assessment & Plan: Visit Diagnoses:  1. Impingement syndrome of shoulder, left   2. 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 I recommended home exercises and warming up before exercise. Pennsaid prescription was given. Continue with NSAIDs as needed and ice as needed for me as needed.  Follow-Up Instructions: Return if symptoms worsen or fail to improve.   Orders:  Orders Placed This Encounter  Procedures  . XR Shoulder Left  . XR Shoulder Right   Meds ordered this encounter  Medications  . Diclofenac Sodium 2 % SOLN    Sig: Place 2 g onto the skin 2 (two) times daily as needed.    Dispense:  1 Bottle    Refill:  3      Procedures: No procedures performed   Clinical Data: No additional findings.   Subjective: Chief Complaint  Patient presents with  . Right Shoulder - Pain  . Left Shoulder - Pain    Patient is a 71 year old gentleman with bilateral shoulder pain to 4 weeks. Pain is worse with movement above the shoulder. He denies any injuries. He is very active and swims 3 times a week. Denies any radiation of pain. He has been icing it and taking oral NSAIDs which have helped. The pain is mild.    Review of Systems  Constitutional: Negative.   All other systems reviewed and are negative.    Objective: Vital Signs: Ht 6' (1.829 m)   Wt 195 lb (88.5 kg)   BMI 26.45 kg/m   Physical Exam  Constitutional: He is oriented to person, place, and time. He appears well-developed and well-nourished.  HENT:  Head: Normocephalic and atraumatic.  Eyes: Pupils are equal, round, and reactive to light.  Neck: Neck supple.  Pulmonary/Chest:  Effort normal.  Abdominal: Soft.  Musculoskeletal: Normal range of motion.  Neurological: He is alert and oriented to person, place, and time.  Skin: Skin is warm.  Psychiatric: He has a normal mood and affect. His behavior is normal. Judgment and thought content normal.  Nursing note and vitals reviewed.   Ortho Exam Exam of bilateral shoulder shows no skin lesions. No structural maladies. He has full range of motion. Mildly positive Hawkins and Neer impingement rotator cuff is normal strength with minimal pain. Negative cross adduction sign. Negative Spurling sign. Specialty Comments:  No specialty comments available.  Imaging: Xr Shoulder Left  Result Date: 07/31/2016 Moderate degenerative changes of acromioclavicular joint  Xr Shoulder Right  Result Date: 07/31/2016 Moderate degenerative changes of acromioclavicular joint.    PMFS History: Patient Active Problem List   Diagnosis Date Noted  . Impingement syndrome of right shoulder 07/31/2016  . Impingement syndrome of shoulder, left 07/31/2016   Past Medical History:  Diagnosis Date  . Allergy   . Arthritis   . H/O hiatal hernia   . No pertinent past medical history     Family History  Problem Relation Age of Onset  . Heart disease Brother   . Hyperlipidemia Brother     Past Surgical History:  Procedure Laterality Date  . CARDIAC CATHETERIZATION  5/12  . CARPAL TUNNEL RELEASE  08/13/2011   Procedure: CARPAL TUNNEL RELEASE;  Surgeon: Wynonia Sours, MD;  Location: [place];  Service: Orthopedics;  Laterality: Left;  . COLONOSCOPY    . FOREIGN BODY REMOVAL  08/13/2011   Procedure: FOREIGN BODY REMOVAL ADULT;  Surgeon: Wynonia Sours, MD;  Location: [place];  Service: Orthopedics;  Laterality: Left;  removal foreign body index finger metacarpal phalangeal  . JOINT REPLACEMENT  2010   lt mod total knee  . JOINT REPLACEMENT  2008   rt total knee  . KNEE ARTHROPLASTY     rt and lt as  young man  . KNEE ARTHROSCOPY     right and left  . ORIF FINGER FRACTURE  4/11   rt index   Social History   Occupational History  . Not on file.   Social History Main Topics  . Smoking status: Former Smoker    Quit date: 08/07/1971  . Smokeless tobacco: Never Used  . Alcohol use Yes     Comment: daily  . Drug use: No  . Sexual activity: Not on file

## 2016-08-02 DIAGNOSIS — M25551 Pain in right hip: Secondary | ICD-10-CM | POA: Diagnosis not present

## 2016-08-09 ENCOUNTER — Other Ambulatory Visit (INDEPENDENT_AMBULATORY_CARE_PROVIDER_SITE_OTHER): Payer: Self-pay

## 2016-08-09 MED ORDER — DICLOFENAC SODIUM 1 % TD GEL: TRANSDERMAL | 0 refills | Status: DC

## 2016-08-21 DIAGNOSIS — M25551 Pain in right hip: Secondary | ICD-10-CM | POA: Diagnosis not present

## 2016-09-04 DIAGNOSIS — M170 Bilateral primary osteoarthritis of knee: Secondary | ICD-10-CM | POA: Diagnosis not present

## 2016-10-02 DIAGNOSIS — M25511 Pain in right shoulder: Secondary | ICD-10-CM | POA: Diagnosis not present

## 2016-10-09 DIAGNOSIS — M25511 Pain in right shoulder: Secondary | ICD-10-CM | POA: Diagnosis not present

## 2016-10-15 DIAGNOSIS — I714 Abdominal aortic aneurysm, without rupture: Secondary | ICD-10-CM | POA: Diagnosis not present

## 2016-10-15 DIAGNOSIS — M1611 Unilateral primary osteoarthritis, right hip: Secondary | ICD-10-CM | POA: Diagnosis not present

## 2016-10-15 DIAGNOSIS — Z01818 Encounter for other preprocedural examination: Secondary | ICD-10-CM | POA: Diagnosis not present

## 2016-10-15 DIAGNOSIS — R001 Bradycardia, unspecified: Secondary | ICD-10-CM | POA: Diagnosis not present

## 2016-10-24 DIAGNOSIS — R072 Precordial pain: Secondary | ICD-10-CM | POA: Diagnosis not present

## 2016-10-24 DIAGNOSIS — Z8249 Family history of ischemic heart disease and other diseases of the circulatory system: Secondary | ICD-10-CM | POA: Diagnosis not present

## 2016-10-24 DIAGNOSIS — R001 Bradycardia, unspecified: Secondary | ICD-10-CM | POA: Diagnosis not present

## 2016-10-24 DIAGNOSIS — R9431 Abnormal electrocardiogram [ECG] [EKG]: Secondary | ICD-10-CM | POA: Diagnosis not present

## 2016-10-24 DIAGNOSIS — R9430 Abnormal result of cardiovascular function study, unspecified: Secondary | ICD-10-CM | POA: Diagnosis not present

## 2016-10-24 DIAGNOSIS — I714 Abdominal aortic aneurysm, without rupture: Secondary | ICD-10-CM | POA: Diagnosis not present

## 2016-10-24 DIAGNOSIS — Z01810 Encounter for preprocedural cardiovascular examination: Secondary | ICD-10-CM | POA: Diagnosis not present

## 2016-10-29 DIAGNOSIS — M25551 Pain in right hip: Secondary | ICD-10-CM | POA: Diagnosis not present

## 2016-11-30 ENCOUNTER — Encounter (HOSPITAL_COMMUNITY): Payer: Self-pay

## 2016-11-30 NOTE — Pre-Procedure Instructions (Addendum)
SEVERN GODDARD  11/30/2016      [id number] [address], [address] [place] [address] Phone: [phone] Fax: [phone]    Your procedure is scheduled on July 18  Report to [facility] at 630 A.M.  Call this number if you have problems the morning of surgery:  [phone]   Remember:  Do not eat food or drink liquids after midnight.  Take these medicines the morning of surgery with A SIP OF WATER lansoprazole (Prevacid)   Stop taking BC's, Goody's, Herbal medications, Fish Oil, Ibuprofen, Advil, motrin, Aleve Stop/ take aspirin as directed by your Dr.   Lazaro Arms not wear jewelry, make-up or nail polish.  Do not wear lotions, powders, or perfumes, or deoderant.  Do not shave 48 hours prior to surgery.  Men may shave face and neck.  Do not bring valuables to the hospital.  [facility] is not responsible for any belongings or valuables.  Contacts, dentures or bridgework may not be worn into surgery.  Leave your suitcase in the car.  After surgery it may be brought to your room.  For patients admitted to the hospital, discharge time will be determined by your treatment team.  Patients discharged the day of surgery will not be allowed to drive home.   Special instructions:  [place] - Preparing for Surgery  Before surgery, you can play an important role.  Because skin is not sterile, your skin needs to be as free of germs as possible.  You can reduce the number of germs on you skin by washing with CHG (chlorahexidine gluconate) soap before surgery.  CHG is an antiseptic cleaner which kills germs and bonds with the skin to continue killing germs even after washing.  Please DO NOT use if you have an allergy to CHG or antibacterial soaps.  If your skin becomes reddened/irritated stop using the CHG and inform your nurse when you arrive at Short Stay.  Do not shave (including legs and underarms) for at least  48 hours prior to the first CHG shower.  You may shave your face.  Please follow these instructions carefully:   1.  Shower with CHG Soap the night before surgery and the                                morning of Surgery.  2.  If you choose to wash your hair, wash your hair first as usual with your       normal shampoo.  3.  After you shampoo, rinse your hair and body thoroughly to remove the                      Shampoo.  4.  Use CHG as you would any other liquid soap.  You can apply chg directly       to the skin and wash gently with scrungie or a clean washcloth.  5.  Apply the CHG Soap to your body ONLY FROM THE NECK DOWN.        Do not use on open wounds or open sores.  Avoid contact with your eyes,       ears, mouth and genitals (private parts).  Wash genitals (private parts)       with your normal soap.  6.  Wash thoroughly, paying special attention to the area where your  surgery        will be performed.  7.  Thoroughly rinse your body with warm water from the neck down.  8.  DO NOT shower/wash with your normal soap after using and rinsing off       the CHG Soap.  9.  Pat yourself dry with a clean towel.            10.  Wear clean pajamas.            11.  Place clean sheets on your bed the night of your first shower and do not        sleep with pets.  Day of Surgery  Do not apply any lotions/deoderants the morning of surgery.  Please wear clean clothes to the hospital/surgery center.     Please read over the following fact sheets that you were given. Pain Booklet, Coughing and Deep Breathing, MRSA Information and Surgical Site Infection Prevention

## 2016-12-03 ENCOUNTER — Encounter (HOSPITAL_COMMUNITY): Payer: Self-pay

## 2016-12-03 ENCOUNTER — Encounter (HOSPITAL_COMMUNITY)
Admission: RE | Admit: 2016-12-03 | Discharge: 2016-12-03 | Disposition: A | Discharge status: Home / Self Care | Payer: PPO | Source: Ambulatory Visit | Attending: Orthopedic Surgery | Admitting: Orthopedic Surgery

## 2016-12-03 DIAGNOSIS — Z01812 Encounter for preprocedural laboratory examination: Secondary | ICD-10-CM | POA: Diagnosis not present

## 2016-12-03 HISTORY — DX: Essential (primary) hypertension: I10

## 2016-12-03 HISTORY — DX: Atherosclerotic heart disease of native coronary artery without angina pectoris: I25.10

## 2016-12-03 LAB — COMPREHENSIVE METABOLIC PANEL
ALBUMIN: 4.2 g/dL (ref 3.5–5.0)
ALT: 18 U/L (ref 17–63)
AST: 23 U/L (ref 15–41)
Alkaline Phosphatase: 54 U/L (ref 38–126)
Anion gap: 8 (ref 5–15)
BUN: 14 mg/dL (ref 6–20)
CHLORIDE: 104 mmol/L (ref 101–111)
CO2: 25 mmol/L (ref 22–32)
CREATININE: 0.99 mg/dL (ref 0.61–1.24)
Calcium: 9 mg/dL (ref 8.9–10.3)
GFR calc non Af Amer: >60 mL/min (ref >60)
Glucose, Bld: 98 mg/dL (ref 65–99)
Potassium: 3.9 mmol/L (ref 3.5–5.1)
SODIUM: 137 mmol/L (ref 135–145)
Total Bilirubin: 0.7 mg/dL (ref 0.3–1.2)
Total Protein: 6.4 g/dL — ABNORMAL LOW (ref 6.5–8.1)

## 2016-12-03 LAB — SURGICAL PCR SCREEN
MRSA, PCR: NEGATIVE
STAPHYLOCOCCUS AUREUS: NEGATIVE

## 2016-12-03 LAB — CBC
HCT: 39.6 % (ref 39.0–52.0)
Hemoglobin: 13.5 g/dL (ref 13.0–17.0)
MCH: 31.8 pg (ref 26.0–34.0)
MCHC: 34.1 g/dL (ref 30.0–36.0)
MCV: 93.4 fL (ref 78.0–100.0)
PLATELETS: 211 10*3/uL (ref 150–400)
RBC: 4.24 MIL/uL (ref 4.22–5.81)
RDW: 13.4 % (ref 11.5–15.5)
WBC: 6.9 10*3/uL (ref 4.0–10.5)

## 2016-12-03 LAB — URINALYSIS, ROUTINE W REFLEX MICROSCOPIC
BILIRUBIN URINE: NEGATIVE
Glucose, UA: NEGATIVE mg/dL
Hgb urine dipstick: NEGATIVE
Ketones, ur: NEGATIVE mg/dL
Leukocytes, UA: NEGATIVE
NITRITE: NEGATIVE
Protein, ur: NEGATIVE mg/dL
Specific Gravity, Urine: 1.009 (ref 1.005–1.030)
pH: 5 (ref 5.0–8.0)

## 2016-12-03 LAB — TYPE AND SCREEN
ABO/RH(D): O POS
ANTIBODY SCREEN: NEGATIVE

## 2016-12-03 NOTE — Pre-Procedure Instructions (Signed)
Kenneth Wagner  12/03/2016      [id number] [address], [place] [address] [address] Phone: [phone] Fax: [phone]    Your procedure is scheduled on July 18  Report to [facility] at 630 A.M.  Call this number if you have problems the morning of surgery:  [phone]   Remember:  Do not eat food or drink liquids after midnight.  Take these medicines the morning of surgery with A SIP OF WATER: None    Stop taking aspirin, BC's, Goody's, Herbal medications, Fish Oil, Ibuprofen, Advil, motrin, Aleve   Do not wear jewelry, make-up or nail polish.  Do not wear lotions, powders, or perfumes, or deoderant.  Do not shave 48 hours prior to surgery.  Men may shave face and neck.  Do not bring valuables to the hospital.  [facility] is not responsible for any belongings or valuables.  Contacts, dentures or bridgework may not be worn into surgery.  Leave your suitcase in the car.  After surgery it may be brought to your room.  For patients admitted to the hospital, discharge time will be determined by your treatment team.   Special instructions:  [place] - Preparing for Surgery  Before surgery, you can play an important role.  Because skin is not sterile, your skin needs to be as free of germs as possible.  You can reduce the number of germs on you skin by washing with CHG (chlorahexidine gluconate) soap before surgery.  CHG is an antiseptic cleaner which kills germs and bonds with the skin to continue killing germs even after washing.  Please DO NOT use if you have an allergy to CHG or antibacterial soaps.  If your skin becomes reddened/irritated stop using the CHG and inform your nurse when you arrive at Short Stay.  Do not shave (including legs and underarms) for at least 48 hours prior to the first CHG shower.  You may shave your face.  Please follow these instructions carefully:   1.  Shower  with CHG Soap the night before surgery and the                                morning of Surgery.  2.  If you choose to wash your hair, wash your hair first as usual with your       normal shampoo.  3.  After you shampoo, rinse your hair and body thoroughly to remove the                      Shampoo.  4.  Use CHG as you would any other liquid soap.  You can apply chg directly       to the skin and wash gently with scrungie or a clean washcloth.  5.  Apply the CHG Soap to your body ONLY FROM THE NECK DOWN.        Do not use on open wounds or open sores.  Avoid contact with your eyes,       ears, mouth and genitals (private parts).  Wash genitals (private parts)       with your normal soap.  6.  Wash thoroughly, paying special attention to the area where your surgery        will be performed.  7.  Thoroughly rinse your body with warm water  from the neck down.  8.  DO NOT shower/wash with your normal soap after using and rinsing off       the CHG Soap.  9.  Pat yourself dry with a clean towel.            10.  Wear clean pajamas.            11.  Place clean sheets on your bed the night of your first shower and do not        sleep with pets.  Day of Surgery  Do not apply any lotions/deoderants the morning of surgery.  Please wear clean clothes to the hospital/surgery center.   Please read over the following fact sheets that you were given. Pain Booklet, Coughing and Deep Breathing, MRSA Information and Surgical Site Infection Prevention

## 2016-12-03 NOTE — Progress Notes (Signed)
PCP - [place] Cardiologist -  Tollie Eth  Chest x-ray - Denies EKG - 10/24/16- Requested Stress Test - 2004 ECHO - Denies Cardiac Cath - 11/25/02  Sleep Study - Denies CPAP - None   Pt denies having chest pain, sob, or fever at this time. Documents requested from the cardiologist. Chart given to anesthesia for review due to heart hx. All instructions explained to the pt, with a verbal understanding of the material. Pt agrees to go over the instructions while at home for a better understanding. The opportunity to ask questions was provided.

## 2016-12-04 DIAGNOSIS — M25551 Pain in right hip: Secondary | ICD-10-CM | POA: Diagnosis not present

## 2016-12-04 NOTE — H&P (Signed)
TOTAL HIP ADMISSION H&P  Patient is admitted for right total hip arthroplasty.  Subjective:  Chief Complaint: right hip pain  HPI: Kenneth Wagner, 71 y.o. male, has a history of pain and functional disability in the right hip(s) due to arthritis and patient has failed non-surgical conservative treatments for greater than 12 weeks to include NSAID's and/or analgesics and corticosteriod injections.  Onset of symptoms was gradual starting 2 years ago with rapidlly worsening course since that time.The patient noted no past surgery on the right hip(s).  Patient currently rates pain in the right hip at 6 out of 10 with activity. Patient has night pain, worsening of pain with activity and weight bearing and trendelenberg gait. Patient has evidence of subchondral sclerosis and joint space narrowing by imaging studies. This condition presents safety issues increasing the risk of falls.  There is no current active infection.  Patient Active Problem List   Diagnosis Date Noted  . Impingement syndrome of right shoulder 07/31/2016  . Impingement syndrome of shoulder, left 07/31/2016   Past Medical History:  Diagnosis Date  . Allergy   . Arthritis   . Coronary artery disease   . H/O hiatal hernia   . Hypertension   . No pertinent past medical history     Past Surgical History:  Procedure Laterality Date  . CARDIAC CATHETERIZATION  5/12  . CARPAL TUNNEL RELEASE  08/13/2011   Procedure: CARPAL TUNNEL RELEASE;  Surgeon: Wynonia Sours, MD;  Location: [facility];  Service: Orthopedics;  Laterality: Left;  . COLONOSCOPY    . FOREIGN BODY REMOVAL  08/13/2011   Procedure: FOREIGN BODY REMOVAL ADULT;  Surgeon: Wynonia Sours, MD;  Location: [facility];  Service: Orthopedics;  Laterality: Left;  removal foreign body index finger metacarpal phalangeal  . JOINT REPLACEMENT  2010   lt mod total knee  . JOINT REPLACEMENT  2008   rt total knee  . KNEE ARTHROPLASTY     rt and lt  as young man  . KNEE ARTHROSCOPY     right and left  . ORIF FINGER FRACTURE  4/11   rt index  . TONSILLECTOMY      No prescriptions prior to admission.   Allergies  Allergen Reactions  . Bee Venom Anaphylaxis  . Lodine [Etodolac] Other (See Comments)    Gi upset    Social History  Substance Use Topics  . Smoking status: Former Smoker    Quit date: 08/07/1971  . Smokeless tobacco: Never Used  . Alcohol use Yes     Comment: daily    Family History  Problem Relation Age of Onset  . Heart disease Brother   . Hyperlipidemia Brother      Review of Systems  Constitutional: Negative.   HENT: Negative.   Eyes: Negative.   Respiratory: Negative.   Cardiovascular: Negative.   Gastrointestinal: Negative.   Genitourinary: Negative.   Musculoskeletal: Positive for joint pain.  Skin: Negative.   Neurological: Negative.   Endo/Heme/Allergies: Negative.   Psychiatric/Behavioral: Negative.     Objective:  Physical Exam  Constitutional: He is oriented to person, place, and time. He appears well-developed and well-nourished.  HENT:  Head: Normocephalic and atraumatic.  Eyes: EOM are normal. Pupils are equal, round, and reactive to light.  Neck: Normal range of motion. Neck supple.  Cardiovascular: Normal rate and regular rhythm.   Respiratory: Effort normal and breath sounds normal.  GI: Soft. Bowel sounds are normal.  Musculoskeletal:  Positive log  roll. Negative slr  Neurological: He is alert and oriented to person, place, and time.  Skin: Skin is warm and dry.  Psychiatric: He has a normal mood and affect. His behavior is normal. Judgment and thought content normal.    Vital signs in last 24 hours: Temp:  [97.8 F (36.6 C)] 97.8 F (36.6 C) (07/09 1037) Pulse Rate:  [55] 55 (07/09 1037) Resp:  [18] 18 (07/09 1037) BP: (141)/(83) 141/83 (07/09 1037) SpO2:  [98 %] 98 % (07/09 1037) Weight:  [90.2 kg (198 lb 13.7 oz)] 90.2 kg (198 lb 13.7 oz) (07/09  1037)  Labs:   Estimated body mass index is 26.97 kg/m as calculated from the following:   Height as of 12/03/16: 6' (1.829 m).   Weight as of 12/03/16: 90.2 kg (198 lb 13.7 oz).   Imaging Review Plain radiographs demonstrate severe degenerative joint disease of the right hip(s). The bone quality appears to be fair for age and reported activity level.  Assessment/Plan:  End stage arthritis, right hip(s)  The patient history, physical examination, clinical judgement of the provider and imaging studies are consistent with end stage degenerative joint disease of the right hip(s) and total hip arthroplasty is deemed medically necessary. The treatment options including medical management, injection therapy, arthroscopy and arthroplasty were discussed at length. The risks and benefits of total hip arthroplasty were presented and reviewed. The risks due to aseptic loosening, infection, stiffness, dislocation/subluxation,  thromboembolic complications and other imponderables were discussed.  The patient acknowledged the explanation, agreed to proceed with the plan and consent was signed. Patient is being admitted for inpatient treatment for surgery, pain control, PT, OT, prophylactic antibiotics, VTE prophylaxis, progressive ambulation and ADL's and discharge planning.The patient is planning to be discharged home with home health services

## 2016-12-04 NOTE — Progress Notes (Signed)
Anesthesia Chart Review:  Patient is a 71 year old male scheduled for R total hip arthroplasty anterior approach on 12/12/2016 with Kathryne Hitch, M.D.  - PCP is Milagros Evener, M.D. who cleared patient for surgery. - [facility] Cardiologist is Tollie Eth, M.D. 10/24/16 who cleared patient for surgery  PMH includes: CAD, HTN, AAA (3.1cm by [place] 04/27/16). Former smoker. BMI 27.  Medications include: ASA 81 mg, Lipitor, Prevacid, sildenafil  BP (!) 141/83   Pulse (!) 55   Temp 36.6 C (Oral)   Resp 18   Ht 6' (1.829 m)   Wt 198 lb 13.7 oz (90.2 kg)   SpO2 98%   BMI 26.97 kg/m    Preoperative labs reviewed.  EKG 10/24/16: NSR  US aorta 04/27/16:  - 3.1 cm distal abdominal aortic aneurysm. Recommend followup by [place] in 3 years  Cardiac cath 10/29/02:  1. Very mild coronary artery disease with calcification noted in the proximal left anterior descending and mild irregularities in the vessel. 2. Normal left ventricular function.  If no changes, I anticipate pt can proceed with surgery as scheduled.   Willeen Cass, FNP-BC [facility] Short Stay Surgical Center/Anesthesiology Phone: [phone] 12/04/2016 4:24 PM

## 2016-12-07 ENCOUNTER — Other Ambulatory Visit (HOSPITAL_COMMUNITY): Payer: PPO

## 2016-12-11 MED ORDER — TRANEXAMIC ACID 1000 MG/10ML IV SOLN
1000.0000 mg | INTRAVENOUS | Status: AC
  Administered 2016-12-12: 1000 mg via INTRAVENOUS
  Filled 2016-12-11: qty 10

## 2016-12-11 MED ORDER — LACTATED RINGERS IV SOLN
INTRAVENOUS | Status: DC
  Administered 2016-12-12 (×2): via INTRAVENOUS

## 2016-12-11 MED ORDER — CEFAZOLIN SODIUM-DEXTROSE 2-4 GM/100ML-% IV SOLN
2.0000 g | INTRAVENOUS | Status: AC
  Administered 2016-12-12: 2 g via INTRAVENOUS
  Filled 2016-12-11: qty 100

## 2016-12-12 ENCOUNTER — Inpatient Hospital Stay (HOSPITAL_COMMUNITY)
Admission: RE | Admit: 2016-12-12 | Discharge: 2016-12-13 | DRG: 470 | Disposition: A | Discharge status: Home Health | Payer: PPO | Source: Ambulatory Visit | Attending: Orthopedic Surgery | Admitting: Orthopedic Surgery

## 2016-12-12 ENCOUNTER — Inpatient Hospital Stay (HOSPITAL_COMMUNITY): Payer: PPO | Admitting: Anesthesiology

## 2016-12-12 ENCOUNTER — Encounter (HOSPITAL_COMMUNITY)
Admission: RE | Disposition: A | Discharge status: Home Health | Payer: Self-pay | Source: Ambulatory Visit | Attending: Orthopedic Surgery

## 2016-12-12 ENCOUNTER — Inpatient Hospital Stay (HOSPITAL_COMMUNITY): Payer: PPO

## 2016-12-12 ENCOUNTER — Encounter (HOSPITAL_COMMUNITY): Payer: Self-pay | Admitting: Anesthesiology

## 2016-12-12 ENCOUNTER — Inpatient Hospital Stay (HOSPITAL_COMMUNITY): Payer: PPO | Admitting: Emergency Medicine

## 2016-12-12 DIAGNOSIS — Z419 Encounter for procedure for purposes other than remedying health state, unspecified: Secondary | ICD-10-CM

## 2016-12-12 DIAGNOSIS — M7541 Impingement syndrome of right shoulder: Secondary | ICD-10-CM | POA: Diagnosis present

## 2016-12-12 DIAGNOSIS — D62 Acute posthemorrhagic anemia: Secondary | ICD-10-CM | POA: Diagnosis not present

## 2016-12-12 DIAGNOSIS — M7542 Impingement syndrome of left shoulder: Secondary | ICD-10-CM | POA: Diagnosis not present

## 2016-12-12 DIAGNOSIS — Z471 Aftercare following joint replacement surgery: Secondary | ICD-10-CM | POA: Diagnosis not present

## 2016-12-12 DIAGNOSIS — Z886 Allergy status to analgesic agent status: Secondary | ICD-10-CM

## 2016-12-12 DIAGNOSIS — Z96653 Presence of artificial knee joint, bilateral: Secondary | ICD-10-CM | POA: Diagnosis not present

## 2016-12-12 DIAGNOSIS — I10 Essential (primary) hypertension: Secondary | ICD-10-CM | POA: Diagnosis present

## 2016-12-12 DIAGNOSIS — Z8349 Family history of other endocrine, nutritional and metabolic diseases: Secondary | ICD-10-CM

## 2016-12-12 DIAGNOSIS — Z8249 Family history of ischemic heart disease and other diseases of the circulatory system: Secondary | ICD-10-CM

## 2016-12-12 DIAGNOSIS — Z87891 Personal history of nicotine dependence: Secondary | ICD-10-CM | POA: Diagnosis not present

## 2016-12-12 DIAGNOSIS — I2510 Atherosclerotic heart disease of native coronary artery without angina pectoris: Secondary | ICD-10-CM | POA: Diagnosis present

## 2016-12-12 DIAGNOSIS — M1611 Unilateral primary osteoarthritis, right hip: Principal | ICD-10-CM | POA: Diagnosis present

## 2016-12-12 DIAGNOSIS — Z91030 Bee allergy status: Secondary | ICD-10-CM | POA: Diagnosis not present

## 2016-12-12 DIAGNOSIS — K449 Diaphragmatic hernia without obstruction or gangrene: Secondary | ICD-10-CM | POA: Diagnosis present

## 2016-12-12 DIAGNOSIS — Z96641 Presence of right artificial hip joint: Secondary | ICD-10-CM | POA: Diagnosis not present

## 2016-12-12 HISTORY — PX: TOTAL HIP ARTHROPLASTY: SHX124

## 2016-12-12 SURGERY — ARTHROPLASTY, HIP, TOTAL, ANTERIOR APPROACH
Anesthesia: Monitor Anesthesia Care | Laterality: Right

## 2016-12-12 MED ORDER — SODIUM CHLORIDE 0.9% FLUSH
INTRAVENOUS | Status: DC | PRN
  Administered 2016-12-12: 40 mL via INTRAVENOUS

## 2016-12-12 MED ORDER — CHLORHEXIDINE GLUCONATE 4 % EX LIQD: 60.0000 mL | Freq: Once | CUTANEOUS | Status: DC

## 2016-12-12 MED ORDER — OXYCODONE HCL 5 MG PO TABS
5.0000 mg | ORAL_TABLET | ORAL | Status: DC | PRN
  Administered 2016-12-12 (×2): 5 mg via ORAL
  Administered 2016-12-12 (×3): 10 mg via ORAL
  Administered 2016-12-13 (×2): 5 mg via ORAL
  Administered 2016-12-13 (×2): 10 mg via ORAL
  Filled 2016-12-12: qty 1
  Filled 2016-12-12 (×5): qty 2
  Filled 2016-12-12 (×2): qty 1

## 2016-12-12 MED ORDER — ASPIRIN EC 325 MG PO TBEC
325.0000 mg | DELAYED_RELEASE_TABLET | Freq: Every day | ORAL | Status: DC
  Administered 2016-12-13: 325 mg via ORAL
  Filled 2016-12-12: qty 1

## 2016-12-12 MED ORDER — DOCUSATE SODIUM 100 MG PO CAPS
100.0000 mg | ORAL_CAPSULE | Freq: Two times a day (BID) | ORAL | Status: DC
  Administered 2016-12-12 – 2016-12-13 (×2): 100 mg via ORAL
  Filled 2016-12-12 (×2): qty 1

## 2016-12-12 MED ORDER — METOCLOPRAMIDE HCL 5 MG PO TABS: 5.0000 mg | ORAL_TABLET | Freq: Three times a day (TID) | ORAL | Status: DC | PRN

## 2016-12-12 MED ORDER — ZOLPIDEM TARTRATE 5 MG PO TABS: 5.0000 mg | ORAL_TABLET | Freq: Every evening | ORAL | Status: DC | PRN

## 2016-12-12 MED ORDER — BUPIVACAINE LIPOSOME 1.3 % IJ SUSP
INTRAMUSCULAR | Status: DC | PRN
  Administered 2016-12-12: 20 mL

## 2016-12-12 MED ORDER — ASPIRIN EC 325 MG PO TBEC: 325.0000 mg | DELAYED_RELEASE_TABLET | Freq: Every day | ORAL | 0 refills | Status: DC

## 2016-12-12 MED ORDER — PROPOFOL 10 MG/ML IV BOLUS
INTRAVENOUS | Status: DC | PRN
  Administered 2016-12-12: 20 mg via INTRAVENOUS

## 2016-12-12 MED ORDER — DEXAMETHASONE SODIUM PHOSPHATE 10 MG/ML IJ SOLN
10.0000 mg | Freq: Once | INTRAMUSCULAR | Status: AC
  Administered 2016-12-13: 10 mg via INTRAVENOUS
  Filled 2016-12-12: qty 1

## 2016-12-12 MED ORDER — MEPERIDINE HCL 25 MG/ML IJ SOLN: 6.2500 mg | INTRAMUSCULAR | Status: DC | PRN

## 2016-12-12 MED ORDER — ACETAMINOPHEN 650 MG RE SUPP: 650.0000 mg | Freq: Four times a day (QID) | RECTAL | Status: DC | PRN

## 2016-12-12 MED ORDER — OXYCODONE-ACETAMINOPHEN 5-325 MG PO TABS: 1.0000 | ORAL_TABLET | ORAL | 0 refills | Status: DC | PRN

## 2016-12-12 MED ORDER — ONDANSETRON HCL 4 MG PO TABS: 4.0000 mg | ORAL_TABLET | Freq: Four times a day (QID) | ORAL | Status: DC | PRN

## 2016-12-12 MED ORDER — ONDANSETRON HCL 4 MG/2ML IJ SOLN
INTRAMUSCULAR | Status: DC | PRN
  Administered 2016-12-12: 4 mg via INTRAVENOUS

## 2016-12-12 MED ORDER — POTASSIUM CHLORIDE IN NACL 20-0.9 MEQ/L-% IV SOLN
INTRAVENOUS | Status: DC
  Administered 2016-12-12 – 2016-12-13 (×2): via INTRAVENOUS
  Filled 2016-12-12 (×2): qty 1000

## 2016-12-12 MED ORDER — PROPOFOL 500 MG/50ML IV EMUL
INTRAVENOUS | Status: DC | PRN
  Administered 2016-12-12: 25 ug/kg/min via INTRAVENOUS

## 2016-12-12 MED ORDER — MIDAZOLAM HCL 2 MG/2ML IJ SOLN
INTRAMUSCULAR | Status: AC
  Filled 2016-12-12: qty 2

## 2016-12-12 MED ORDER — SUCCINYLCHOLINE CHLORIDE 200 MG/10ML IV SOSY
PREFILLED_SYRINGE | INTRAVENOUS | Status: AC
  Filled 2016-12-12: qty 20

## 2016-12-12 MED ORDER — MIDAZOLAM HCL 5 MG/5ML IJ SOLN
INTRAMUSCULAR | Status: DC | PRN
  Administered 2016-12-12 (×2): 1 mg via INTRAVENOUS

## 2016-12-12 MED ORDER — DEXAMETHASONE SODIUM PHOSPHATE 10 MG/ML IJ SOLN
INTRAMUSCULAR | Status: DC | PRN
  Administered 2016-12-12: 10 mg via INTRAVENOUS

## 2016-12-12 MED ORDER — PROPOFOL 10 MG/ML IV BOLUS
INTRAVENOUS | Status: AC
  Filled 2016-12-12: qty 40

## 2016-12-12 MED ORDER — FENTANYL CITRATE (PF) 250 MCG/5ML IJ SOLN
INTRAMUSCULAR | Status: AC
  Filled 2016-12-12: qty 5

## 2016-12-12 MED ORDER — DIAZEPAM 2 MG PO TABS
2.0000 mg | ORAL_TABLET | Freq: Three times a day (TID) | ORAL | Status: DC | PRN
  Administered 2016-12-12 (×2): 2 mg via ORAL
  Filled 2016-12-12: qty 1

## 2016-12-12 MED ORDER — ROCURONIUM BROMIDE 50 MG/5ML IV SOLN
INTRAVENOUS | Status: AC
  Filled 2016-12-12: qty 1

## 2016-12-12 MED ORDER — ONDANSETRON HCL 4 MG/2ML IJ SOLN
4.0000 mg | Freq: Once | INTRAMUSCULAR | Status: DC | PRN
Start: 2016-12-12 — End: 2016-12-12

## 2016-12-12 MED ORDER — FENTANYL CITRATE (PF) 100 MCG/2ML IJ SOLN: 25.0000 ug | INTRAMUSCULAR | Status: DC | PRN

## 2016-12-12 MED ORDER — OXYCODONE HCL 5 MG PO TABS
ORAL_TABLET | ORAL | Status: AC
  Administered 2016-12-12: 10 mg via ORAL
  Filled 2016-12-12: qty 2

## 2016-12-12 MED ORDER — HYDROMORPHONE HCL 1 MG/ML IJ SOLN: 0.5000 mg | INTRAMUSCULAR | Status: DC | PRN

## 2016-12-12 MED ORDER — BUPIVACAINE HCL (PF) 0.5 % IJ SOLN
INTRAMUSCULAR | Status: DC | PRN
  Administered 2016-12-12: 30 mL

## 2016-12-12 MED ORDER — MAGNESIUM CITRATE PO SOLN
1.0000 | Freq: Once | ORAL | Status: DC | PRN
  Filled 2016-12-12: qty 296

## 2016-12-12 MED ORDER — DIAZEPAM 5 MG PO TABS
ORAL_TABLET | ORAL | Status: AC
  Administered 2016-12-12: 2 mg via ORAL
  Filled 2016-12-12: qty 1

## 2016-12-12 MED ORDER — BUPIVACAINE HCL (PF) 0.5 % IJ SOLN
INTRAMUSCULAR | Status: AC
  Filled 2016-12-12: qty 30

## 2016-12-12 MED ORDER — PHENOL 1.4 % MT LIQD: 1.0000 | OROMUCOSAL | Status: DC | PRN

## 2016-12-12 MED ORDER — CEFAZOLIN SODIUM-DEXTROSE 2-4 GM/100ML-% IV SOLN
2.0000 g | Freq: Four times a day (QID) | INTRAVENOUS | Status: AC
  Administered 2016-12-12 (×2): 2 g via INTRAVENOUS
  Filled 2016-12-12 (×3): qty 100

## 2016-12-12 MED ORDER — ACETAMINOPHEN 325 MG PO TABS: 650.0000 mg | ORAL_TABLET | Freq: Four times a day (QID) | ORAL | Status: DC | PRN

## 2016-12-12 MED ORDER — BISACODYL 5 MG PO TBEC: 5.0000 mg | DELAYED_RELEASE_TABLET | Freq: Every day | ORAL | Status: DC | PRN

## 2016-12-12 MED ORDER — ONDANSETRON HCL 4 MG/2ML IJ SOLN: 4.0000 mg | Freq: Four times a day (QID) | INTRAMUSCULAR | Status: DC | PRN

## 2016-12-12 MED ORDER — POLYETHYLENE GLYCOL 3350 17 G PO PACK: 17.0000 g | PACK | Freq: Every day | ORAL | Status: DC | PRN

## 2016-12-12 MED ORDER — DIPHENHYDRAMINE HCL 12.5 MG/5ML PO ELIX: 12.5000 mg | ORAL_SOLUTION | ORAL | Status: DC | PRN

## 2016-12-12 MED ORDER — 0.9 % SODIUM CHLORIDE (POUR BTL) OPTIME
TOPICAL | Status: DC | PRN
  Administered 2016-12-12: 1000 mL

## 2016-12-12 MED ORDER — MENTHOL 3 MG MT LOZG: 1.0000 | LOZENGE | OROMUCOSAL | Status: DC | PRN

## 2016-12-12 MED ORDER — BUPIVACAINE IN DEXTROSE 0.75-8.25 % IT SOLN
INTRATHECAL | Status: DC | PRN
  Administered 2016-12-12: 12 mg via INTRATHECAL

## 2016-12-12 MED ORDER — METOCLOPRAMIDE HCL 5 MG/ML IJ SOLN: 5.0000 mg | Freq: Three times a day (TID) | INTRAMUSCULAR | Status: DC | PRN

## 2016-12-12 MED ORDER — FENTANYL CITRATE (PF) 100 MCG/2ML IJ SOLN
INTRAMUSCULAR | Status: DC | PRN
  Administered 2016-12-12 (×5): 50 ug via INTRAVENOUS

## 2016-12-12 MED ORDER — ONDANSETRON HCL 4 MG PO TABS: 4.0000 mg | ORAL_TABLET | Freq: Three times a day (TID) | ORAL | 0 refills | Status: DC | PRN

## 2016-12-12 MED ORDER — BUPIVACAINE LIPOSOME 1.3 % IJ SUSP
20.0000 mL | Freq: Once | INTRAMUSCULAR | Status: DC
  Filled 2016-12-12: qty 20

## 2016-12-12 SURGICAL SUPPLY — 65 items
BLADE CLIPPER SURG (BLADE) IMPLANT
BLADE SAG 18X100X1.27 (BLADE) ×2 IMPLANT
BLADE SAW SGTL 18X1.27X75 (BLADE) ×2 IMPLANT
CAPT HIP TOTAL 2 ×2 IMPLANT
CELLS DAT CNTRL 66122 CELL SVR (MISCELLANEOUS) ×1 IMPLANT
CLSR STERI-STRIP ANTIMIC 1/2X4 (GAUZE/BANDAGES/DRESSINGS) ×4 IMPLANT
COVER SURGICAL LIGHT HANDLE (MISCELLANEOUS) ×2 IMPLANT
DRAPE C-ARM 42X72 X-RAY (DRAPES) ×2 IMPLANT
DRAPE HALF SHEET 40X57 (DRAPES) ×2 IMPLANT
DRAPE IMP U-DRAPE 54X76 (DRAPES) ×6 IMPLANT
DRAPE INCISE IOBAN 66X45 STRL (DRAPES) ×2 IMPLANT
DRAPE ORTHO SPLIT 77X108 STRL (DRAPES)
DRAPE STERI IOBAN 125X83 (DRAPES) ×2 IMPLANT
DRAPE SURG 17X23 STRL (DRAPES) ×2 IMPLANT
DRAPE SURG ORHT 6 SPLT 77X108 (DRAPES) IMPLANT
DRAPE U-SHAPE 47X51 STRL (DRAPES) ×2 IMPLANT
DRESSING AQUACEL AG SP 3.5X10 (GAUZE/BANDAGES/DRESSINGS) ×1 IMPLANT
DRSG AQUACEL AG ADV 3.5X10 (GAUZE/BANDAGES/DRESSINGS) IMPLANT
DRSG AQUACEL AG SP 3.5X10 (GAUZE/BANDAGES/DRESSINGS) ×2
DURAPREP 26ML APPLICATOR (WOUND CARE) ×2 IMPLANT
ELECT BLADE 4.0 EZ CLEAN MEGAD (MISCELLANEOUS)
ELECT CAUTERY BLADE 6.4 (BLADE) ×2 IMPLANT
ELECT REM PT RETURN 9FT ADLT (ELECTROSURGICAL) ×2
ELECTRODE BLDE 4.0 EZ CLN MEGD (MISCELLANEOUS) IMPLANT
ELECTRODE REM PT RTRN 9FT ADLT (ELECTROSURGICAL) ×1 IMPLANT
FACESHIELD WRAPAROUND (MASK) ×4 IMPLANT
GLOVE BIOGEL PI IND STRL 6.5 (GLOVE) ×3 IMPLANT
GLOVE BIOGEL PI IND STRL 7.0 (GLOVE) IMPLANT
GLOVE BIOGEL PI INDICATOR 6.5 (GLOVE) ×3
GLOVE BIOGEL PI INDICATOR 7.0 (GLOVE)
GLOVE ECLIPSE 7.0 STRL STRAW (GLOVE) ×2 IMPLANT
GLOVE ORTHO TXT STRL SZ7.5 (GLOVE) ×4 IMPLANT
GLOVE SURG ORTHO 7.0 STRL STRW (GLOVE) ×4 IMPLANT
GLOVE SURG SS PI 7.0 STRL IVOR (GLOVE) ×2 IMPLANT
GOWN STRL REUS W/ TWL LRG LVL3 (GOWN DISPOSABLE) ×1 IMPLANT
GOWN STRL REUS W/ TWL XL LVL3 (GOWN DISPOSABLE) ×3 IMPLANT
GOWN STRL REUS W/TWL LRG LVL3 (GOWN DISPOSABLE) ×1
GOWN STRL REUS W/TWL XL LVL3 (GOWN DISPOSABLE) ×3
KIT BASIN OR (CUSTOM PROCEDURE TRAY) ×2 IMPLANT
KIT ROOM TURNOVER OR (KITS) ×2 IMPLANT
MANIFOLD NEPTUNE II (INSTRUMENTS) ×2 IMPLANT
NDL SAFETY ECLIPSE 18X1.5 (NEEDLE) ×1 IMPLANT
NEEDLE HYPO 18GX1.5 SHARP (NEEDLE) ×1
NEEDLE HYPO 25GX1X1/2 BEV (NEEDLE) ×2 IMPLANT
NS IRRIG 1000ML POUR BTL (IV SOLUTION) ×2 IMPLANT
PACK TOTAL JOINT (CUSTOM PROCEDURE TRAY) ×2 IMPLANT
PAD ARMBOARD 7.5X6 YLW CONV (MISCELLANEOUS) ×4 IMPLANT
RTRCTR WOUND ALEXIS 18CM MED (MISCELLANEOUS) ×2
SUCTION FRAZIER HANDLE 10FR (MISCELLANEOUS) ×1
SUCTION TUBE FRAZIER 10FR DISP (MISCELLANEOUS) ×1 IMPLANT
SUT ETHIBOND NAB CT1 #1 30IN (SUTURE) IMPLANT
SUT MNCRL AB 4-0 PS2 18 (SUTURE) ×2 IMPLANT
SUT VIC AB 0 CT1 27 (SUTURE) ×1
SUT VIC AB 0 CT1 27XBRD ANBCTR (SUTURE) ×1 IMPLANT
SUT VIC AB 1 CT1 27 (SUTURE)
SUT VIC AB 1 CT1 27XBRD ANBCTR (SUTURE) IMPLANT
SUT VIC AB 2-0 CT1 27 (SUTURE) ×2
SUT VIC AB 2-0 CT1 TAPERPNT 27 (SUTURE) ×2 IMPLANT
SYR 50ML LL SCALE MARK (SYRINGE) ×2 IMPLANT
SYR CONTROL 10ML LL (SYRINGE) ×2 IMPLANT
TOWEL OR 17X24 6PK STRL BLUE (TOWEL DISPOSABLE) ×2 IMPLANT
TOWEL OR 17X26 10 PK STRL BLUE (TOWEL DISPOSABLE) ×2 IMPLANT
TRAY CATH 16FR W/PLASTIC CATH (SET/KITS/TRAYS/PACK) IMPLANT
TRAY FOLEY CATH SILVER 14FR (SET/KITS/TRAYS/PACK) IMPLANT
WATER STERILE IRR 1000ML POUR (IV SOLUTION) ×2 IMPLANT

## 2016-12-12 NOTE — Anesthesia Procedure Notes (Signed)
Procedure Name: MAC Date/Time: 12/12/2016 8:34 AM Performed by: Scheryl Darter Pre-anesthesia Checklist: Patient identified, Emergency Drugs available, Suction available, Patient being monitored and Timeout performed Patient Re-evaluated:Patient Re-evaluated prior to induction Oxygen Delivery Method: Simple face mask Placement Confirmation: positive ETCO2

## 2016-12-12 NOTE — Evaluation (Signed)
Physical Therapy Evaluation Patient Details Name: Kenneth Wagner MRN: [id number] DOB: [birth year] Today's Date: 12/12/2016   History of Present Illness  Pt is a 71 y/o male s/p L THA, anterior hip precautions. PMH includes HTN, CAD s/p cardiac cath, R shoulder impingement, and bilat TKA.   Clinical Impression  Pt is s/p surgery above with deficits below. PTA, pt was independent with functional mobility. Upon eval, pt limited by post op pain and weakness, as well as, decreased balance. Pt required min guard assist for mobility this session and required verbal cues to maintain precautions. Pt reports wife will be able to assist as needed upon d/c home. Has all necessary DME and pt reports he will be getting HHPT. Will continue to follow acutely to maximize functional mobility independence.     Follow Up Recommendations DC plan and follow up therapy as arranged by surgeon;Supervision/Assistance - 24 hour    Equipment Recommendations  None recommended by PT (has all necessary DME)    Recommendations for Other Services       Precautions / Restrictions Precautions Precautions: Anterior Hip Precaution Booklet Issued: Yes (comment) Precaution Comments: Reviewed anterior precautions and THA exercises  Restrictions Weight Bearing Restrictions: Yes RLE Weight Bearing: Weight bearing as tolerated      Mobility  Bed Mobility Overal bed mobility: Needs Assistance Bed Mobility: Supine to Sit     Supine to sit: Supervision     General bed mobility comments: Supervision for safety   Transfers Overall transfer level: Needs assistance Equipment used: Rolling walker (2 wheeled) Transfers: Sit to/from Stand Sit to Stand: Min guard         General transfer comment: Min guard for safety. Verbal cues for safe hand placement.   Ambulation/Gait Ambulation/Gait assistance: Min guard Ambulation Distance (Feet): 75 Feet Assistive device: Rolling walker (2 wheeled) Gait  Pattern/deviations: Step-to pattern;Decreased step length - right;Decreased step length - left;Decreased weight shift to right;Antalgic Gait velocity: Decreased Gait velocity interpretation: Below normal speed for age/gender General Gait Details: Slow, antalgic gait secondary to post op pain and weakness. Required verbal cues for LE sequencing to maintain hip precautions.   Stairs            Wheelchair Mobility    Modified Rankin (Stroke Patients Only)       Balance Overall balance assessment: Needs assistance Sitting-balance support: No upper extremity supported;Feet supported Sitting balance-Leahy Scale: Good     Standing balance support: Bilateral upper extremity supported;During functional activity Standing balance-Leahy Scale: Poor Standing balance comment: Reliant on RW for stability                              Pertinent Vitals/Pain Pain Assessment: 0-10 Pain Score: 3  Pain Location: R hip  Pain Descriptors / Indicators: Aching;Operative site guarding Pain Intervention(s): Limited activity within patient's tolerance;Monitored during session;Repositioned    Home Living Family/patient expects to be discharged to:: Private residence Living Arrangements: Spouse/significant other Available Help at Discharge: Family;Available 24 hours/day Type of Home: House Home Access: Stairs to enter Entrance Stairs-Rails: None Entrance Stairs-Number of Steps: 4 Home Layout: Two level;Able to live on main level with bedroom/bathroom Home Equipment: Bedside commode;Walker - 2 wheels      Prior Function Level of Independence: Independent               Hand Dominance   Dominant Hand: Left    Extremity/Trunk Assessment   Upper Extremity Assessment Upper Extremity Assessment:  Overall [facility] for tasks assessed    Lower Extremity Assessment Lower Extremity Assessment: RLE deficits/detail RLE Deficits / Details: Sensory in tact. Deficits consistent with post  op pain and weakness. Able to perform exercise below.     Cervical / Trunk Assessment Cervical / Trunk Assessment: Normal  Communication   Communication: No difficulties  Cognition Arousal/Alertness: Awake/alert Behavior During Therapy: WFL for tasks assessed/performed Overall Cognitive Status: Within Functional Limits for tasks assessed                                        General Comments General comments (skin integrity, edema, etc.): Pt's wife present throughout session     Exercises Total Joint Exercises Ankle Circles/Pumps: AROM;Both;10 reps;Supine Quad Sets: AROM;Right;10 reps;Supine Short Arc Quad: AROM;Right;10 reps;Supine Heel Slides: AROM;Right;10 reps;Supine   Assessment/Plan    PT Assessment Patient needs continued PT services  PT Problem List Decreased strength;Decreased range of motion;Decreased balance;Decreased mobility;Decreased knowledge of use of DME;Decreased knowledge of precautions;Pain       PT Treatment Interventions Gait training;DME instruction;Stair training;Functional mobility training;Therapeutic activities;Therapeutic exercise;Balance training;Neuromuscular re-education;Patient/family education    PT Goals (Current goals can be found in the Care Plan section)  Acute Rehab PT Goals Patient Stated Goal: to go home  PT Goal Formulation: With patient Time For Goal Achievement: 12/19/16 Potential to Achieve Goals: Good    Frequency 7X/week   Barriers to discharge        Co-evaluation               AM-PAC PT "6 Clicks" Daily Activity  Outcome Measure Difficulty turning over in bed (including adjusting bedclothes, sheets and blankets)?: A Little Difficulty moving from lying on back to sitting on the side of the bed? : A Little Difficulty sitting down on and standing up from a chair with arms (e.g., wheelchair, bedside commode, etc,.)?: Total Help needed moving to and from a bed to chair (including a wheelchair)?: A  Little Help needed walking in hospital room?: A Little Help needed climbing 3-5 steps with a railing? : A Little 6 Click Score: 16    End of Session Equipment Utilized During Treatment: Gait belt Activity Tolerance: Patient tolerated treatment well Patient left: in chair;with call bell/phone within reach;with family/visitor present Nurse Communication: Mobility status PT Visit Diagnosis: Other abnormalities of gait and mobility (R26.89);Pain Pain - Right/Left: Right Pain - part of body: Hip    Time: 5883-2549 PT Time Calculation (min) (ACUTE ONLY): 33 min   Charges:   PT Evaluation $PT Eval Low Complexity: 1 Procedure PT Treatments $Gait Training: 8-22 mins   PT G Codes:        Leighton Ruff, PT, DPT  Acute Rehabilitation Services  Pager: [phone]   Rudean Hitt 12/12/2016, 6:54 PM

## 2016-12-12 NOTE — Transfer of Care (Signed)
Immediate Anesthesia Transfer of Care Note  Patient: Kenneth Wagner  Procedure(s) Performed: Procedure(s): TOTAL HIP ARTHROPLASTY ANTERIOR APPROACH (Right)  Patient Location: PACU  Anesthesia Type:MAC and Spinal  Level of Consciousness: awake, alert , oriented and sedated  Airway & Oxygen Therapy: Patient Spontanous Breathing and Patient connected to nasal cannula oxygen  Post-op Assessment: Report given to RN, Post -op Vital signs reviewed and stable and Patient moving all extremities  Post vital signs: Reviewed and stable  Last Vitals:  Vitals:   12/12/16 0707  BP: (!) 166/89  Pulse: 66  Resp: 18  Temp: 36.7 C    Last Pain:  Vitals:   12/12/16 0707  TempSrc: Oral      Patients Stated Pain Goal: 3 (48/18/56 3149)  Complications: No apparent anesthesia complications

## 2016-12-12 NOTE — Discharge Summary (Addendum)
Patient ID: Kenneth Wagner MRN: [id number] DOB/AGE: 1946-01-28 71 y.o.  Admit date: 12/12/2016 Discharge date: 12/13/2016  Admission Diagnoses:  Principal Problem:   Primary localized osteoarthritis of right hip   Discharge Diagnoses:  Same  Past Medical History:  Diagnosis Date  . Allergy   . Arthritis   . Coronary artery disease   . H/O hiatal hernia   . Hypertension   . No pertinent past medical history     Surgeries: Procedure(s): TOTAL HIP ARTHROPLASTY ANTERIOR APPROACH on 12/12/2016   Consultants:   Discharged Condition: Improved  Hospital Course: DAUNE COLGATE is an 71 y.o. male who was admitted 12/12/2016 for operative treatment ofPrimary localized osteoarthritis of right hip. Patient has severe unremitting pain that affects sleep, daily activities, and work/hobbies. After pre-op clearance the patient was taken to the operating room on 12/12/2016 and underwent  Procedure(s): TOTAL HIP ARTHROPLASTY ANTERIOR APPROACH.    Patient was given perioperative antibiotics:  Anti-infectives    Start     Dose/Rate Route Frequency Ordered Stop   12/12/16 1430  ceFAZolin (ANCEF) IVPB 2g/100 mL premix     2 g 200 mL/hr over 30 Minutes Intravenous Every 6 hours 12/12/16 1326 12/12/16 2058   12/12/16 0800  ceFAZolin (ANCEF) IVPB 2g/100 mL premix     2 g 200 mL/hr over 30 Minutes Intravenous To [facility] 12/11/16 1427 12/12/16 0841       Patient was given sequential compression devices, early ambulation, and chemoprophylaxis to prevent DVT.  Patient benefited maximally from hospital stay and there were no complications.    Recent vital signs:  Patient Vitals for the past 24 hrs:  BP Temp Temp src Pulse Resp SpO2  12/13/16 0426 129/65 98.2 F (36.8 C) Oral 67 18 97 %  12/13/16 0034 123/68 98 F (36.7 C) Oral 64 18 96 %  12/12/16 2211 133/79 98.2 F (36.8 C) Oral 69 18 95 %  12/12/16 1500 137/84 98.9 F (37.2 C) Oral (!) 54 19 99 %  12/12/16 1245 126/70  (!) 97 F (36.1 C) - (!) 49 20 100 %  12/12/16 1230 (!) 142/76 - - (!) 48 13 100 %  12/12/16 1215 (!) 145/78 - - (!) 47 15 100 %  12/12/16 1200 129/68 - - 68 11 100 %  12/12/16 1145 139/68 - - (!) 44 11 100 %  12/12/16 1130 133/73 - - 62 17 100 %  12/12/16 1115 120/68 - - (!) 43 11 100 %  12/12/16 1100 114/65 97.8 F (36.6 C) - (!) 50 16 100 %     Recent laboratory studies:   Recent Labs  12/13/16 0424  WBC 11.8*  HGB 10.6*  HCT 31.1*  PLT 190  NA 136  K 4.3  CL 103  CO2 27  BUN 9  CREATININE 0.94  GLUCOSE 142*  CALCIUM 8.1*     Discharge Medications:   Allergies as of 12/13/2016      Reactions   Bee Venom Anaphylaxis   Lodine [etodolac] Other (See Comments)   Gi upset      Medication List    STOP taking these medications   aspirin 81 MG tablet Replaced by:  aspirin EC 325 MG tablet   diclofenac sodium 1 % Gel Commonly known as:  VOLTAREN   Diclofenac Sodium 2 % Soln   naproxen sodium 220 MG tablet Commonly known as:  ANAPROX     TAKE these medications   aspirin EC 325 MG tablet Take 1 tablet (  325 mg total) by mouth daily. Replaces:  aspirin 81 MG tablet   atorvastatin 20 MG tablet Commonly known as:  LIPITOR Take 20 mg by mouth every other day.   Co Q 10 100 MG Caps Take 100 mg by mouth daily.   lansoprazole 15 MG capsule Commonly known as:  PREVACID Take 15 mg by mouth daily at 12 noon.   ondansetron 4 MG tablet Commonly known as:  ZOFRAN Take 1 tablet (4 mg total) by mouth every 8 (eight) hours as needed for nausea or vomiting.   oxyCODONE-acetaminophen 5-325 MG tablet Commonly known as:  ROXICET Take 1-2 tablets by mouth every 4 (four) hours as needed.   sildenafil 20 MG tablet Commonly known as:  REVATIO Take 60 mg by mouth as needed (for ED).       Diagnostic Studies: Dg C-arm 1-60 Min  Result Date: 12/12/2016 CLINICAL DATA:  Right hip replacement EXAM: DG C-ARM 61-120 MIN; OPERATIVE RIGHT HIP WITH PELVIS COMPARISON:  None.  FLUOROSCOPY TIME:  Fluoroscopy Time:  21 seconds Radiation Exposure Index (if provided by the fluoroscopic device): Not available Number of Acquired Spot Images: 3 FINDINGS: Right hip replacement is noted in satisfactory position. No acute bony abnormality is seen. No soft tissue changes are noted. IMPRESSION: Right hip replacement Electronically Signed   By: Inez Catalina M.D.   On: 12/12/2016 10:33   Dg Hip Operative Unilat W Or W/o Pelvis Right  Result Date: 12/12/2016 CLINICAL DATA:  Right hip replacement EXAM: DG C-ARM 61-120 MIN; OPERATIVE RIGHT HIP WITH PELVIS COMPARISON:  None. FLUOROSCOPY TIME:  Fluoroscopy Time:  21 seconds Radiation Exposure Index (if provided by the fluoroscopic device): Not available Number of Acquired Spot Images: 3 FINDINGS: Right hip replacement is noted in satisfactory position. No acute bony abnormality is seen. No soft tissue changes are noted. IMPRESSION: Right hip replacement Electronically Signed   By: Inez Catalina M.D.   On: 12/12/2016 10:33    Disposition: 01-Home or Self Care    Follow-up Information    [name], MD. Schedule an appointment as soon as possible for a visit in 2 weeks.   Specialty:  Orthopedic Surgery Contact information: [address] [place] [address] [phone]            Signed: Fannie Knee 12/13/2016, 7:34 AM

## 2016-12-12 NOTE — Discharge Instructions (Signed)

## 2016-12-12 NOTE — Anesthesia Preprocedure Evaluation (Signed)
Anesthesia Evaluation  Patient identified by MRN, date of birth, ID band Patient awake    Reviewed: Allergy & Precautions, H&P , NPO status , Patient's Chart, lab work & pertinent test results  Airway Mallampati: I  TM Distance: >3 FB Neck ROM: Full    Dental no notable dental hx.    Pulmonary neg pulmonary ROS, former smoker,    Pulmonary exam normal breath sounds clear to auscultation       Cardiovascular hypertension, + CAD  Normal cardiovascular exam Rhythm:Regular Rate:Normal     Neuro/Psych negative neurological ROS  negative psych ROS   GI/Hepatic Neg liver ROS, hiatal hernia,   Endo/Other  negative endocrine ROS  Renal/GU negative Renal ROS  negative genitourinary   Musculoskeletal negative musculoskeletal ROS (+) Arthritis ,   Abdominal   Peds negative pediatric ROS (+)  Hematology negative hematology ROS (+)   Anesthesia Other Findings   Reproductive/Obstetrics negative OB ROS                             Anesthesia Physical  Anesthesia Plan  ASA: III  Anesthesia Plan: Spinal   Post-op Pain Management:    Induction: Intravenous  PONV Risk Score and Plan: 2 and Ondansetron, Dexamethasone and Treatment may vary due to age or medical condition  Airway Management Planned: Nasal Cannula and Natural Airway  Additional Equipment:   Intra-op Plan:   Post-operative Plan: Extubation in OR  Informed Consent: I have reviewed the patients History and Physical, chart, labs and discussed the procedure including the risks, benefits and alternatives for the proposed anesthesia with the patient or authorized representative who has indicated his/her understanding and acceptance.   Dental advisory given  Plan Discussed with: CRNA and Surgeon  Anesthesia Plan Comments: (  )        Anesthesia Quick Evaluation

## 2016-12-12 NOTE — Interval H&P Note (Signed)
History and Physical Interval Note:  12/12/2016 8:24 AM  Kenneth Wagner  has presented today for surgery, with the diagnosis of OA RIGHT HIP  The various methods of treatment have been discussed with the patient and family. After consideration of risks, benefits and other options for treatment, the patient has consented to  Procedure(s): TOTAL HIP ARTHROPLASTY ANTERIOR APPROACH (Right) as a surgical intervention .  The patient's history has been reviewed, patient examined, no change in status, stable for surgery.  I have reviewed the patient's chart and labs.  Questions were answered to the patient's satisfaction.     Kenneth Wagner

## 2016-12-12 NOTE — Anesthesia Procedure Notes (Signed)
Spinal  Patient location during procedure: OR Start time: 12/12/2016 8:38 AM End time: 12/12/2016 8:40 AM Staffing Anesthesiologist: Rhilynn Preyer Preanesthetic Checklist Completed: patient identified, site marked, surgical consent, pre-op evaluation, timeout performed, IV checked, risks and benefits discussed and monitors and equipment checked Spinal Block Patient position: sitting Prep: DuraPrep Patient monitoring: heart rate, cardiac monitor, continuous pulse ox and blood pressure Approach: midline Location: L3-4 Injection technique: single-shot Needle Needle type: Sprotte  Needle gauge: 24 G Needle length: 9 cm Assessment Sensory level: T4

## 2016-12-12 NOTE — Anesthesia Postprocedure Evaluation (Signed)
Anesthesia Post Note  Patient: Kenneth Wagner  Procedure(s) Performed: Procedure(s) (LRB): TOTAL HIP ARTHROPLASTY ANTERIOR APPROACH (Right)     Patient location during evaluation: PACU Anesthesia Type: MAC Level of consciousness: awake and alert Pain management: pain level controlled Vital Signs Assessment: post-procedure vital signs reviewed and stable Respiratory status: spontaneous breathing, nonlabored ventilation, respiratory function stable and patient connected to nasal cannula oxygen Cardiovascular status: stable and blood pressure returned to baseline Anesthetic complications: no    Last Vitals:  Vitals:   12/12/16 1230 12/12/16 1245  BP: (!) 142/76 126/70  Pulse: (!) 48 (!) 49  Resp: 13 20  Temp:  (!) 36.1 C    Last Pain:  Vitals:   12/12/16 0707  TempSrc: Oral                 Caedyn Raygoza

## 2016-12-13 LAB — BASIC METABOLIC PANEL
ANION GAP: 6 (ref 5–15)
BUN: 9 mg/dL (ref 6–20)
CHLORIDE: 103 mmol/L (ref 101–111)
CO2: 27 mmol/L (ref 22–32)
Calcium: 8.1 mg/dL — ABNORMAL LOW (ref 8.9–10.3)
Creatinine, Ser: 0.94 mg/dL (ref 0.61–1.24)
GFR calc Af Amer: >60 mL/min (ref >60)
GLUCOSE: 142 mg/dL — AB (ref 65–99)
POTASSIUM: 4.3 mmol/L (ref 3.5–5.1)
Sodium: 136 mmol/L (ref 135–145)

## 2016-12-13 LAB — CBC
HCT: 31.1 % — ABNORMAL LOW (ref 39.0–52.0)
HEMOGLOBIN: 10.6 g/dL — AB (ref 13.0–17.0)
MCH: 32 pg (ref 26.0–34.0)
MCHC: 34.1 g/dL (ref 30.0–36.0)
MCV: 94 fL (ref 78.0–100.0)
PLATELETS: 190 10*3/uL (ref 150–400)
RBC: 3.31 MIL/uL — AB (ref 4.22–5.81)
RDW: 13.8 % (ref 11.5–15.5)
WBC: 11.8 10*3/uL — AB (ref 4.0–10.5)

## 2016-12-13 NOTE — Progress Notes (Signed)
Subjective: 1 Day Post-Op Procedure(s) (LRB): TOTAL HIP ARTHROPLASTY ANTERIOR APPROACH (Right) Patient reports pain as mild.    Objective: Vital signs in last 24 hours: Temp:  [97 F (36.1 C)-98.9 F (37.2 C)] 98.2 F (36.8 C) (07/19 0426) Pulse Rate:  [43-69] 67 (07/19 0426) Resp:  [11-20] 18 (07/19 0426) BP: (114-145)/(65-84) 129/65 (07/19 0426) SpO2:  [95 %-100 %] 97 % (07/19 0426)  Intake/Output from previous day: 07/18 0701 - 07/19 0700 In: 2281.7 [P.O.:275; I.V.:1906.7; IV Piggyback:100] Out: 1950 [Urine:1800; Blood:150] Intake/Output this shift: No intake/output data recorded.   Recent Labs  12/13/16 0424  HGB 10.6*    Recent Labs  12/13/16 0424  WBC 11.8*  RBC 3.31*  HCT 31.1*  PLT 190    Recent Labs  12/13/16 0424  NA 136  K 4.3  CL 103  CO2 27  BUN 9  CREATININE 0.94  GLUCOSE 142*  CALCIUM 8.1*   No results for input(s): LABPT, INR in the last 72 hours.  Neurologically intact Neurovascular intact Sensation intact distally Intact pulses distally Dorsiflexion/Plantar flexion intact Incision: moderate drainage No cellulitis present Compartment soft  Assessment/Plan: 1 Day Post-Op Procedure(s) (LRB): TOTAL HIP ARTHROPLASTY ANTERIOR APPROACH (Right) Advance diet Up with therapy D/C IV fluids Discharge home with home health after second session of PT WBAT RLE-anterior hip precautions ABLA-mild and stable Dry dressing change by me today.  Can change prn  Fannie Knee 12/13/2016, 7:33 AM

## 2016-12-13 NOTE — Progress Notes (Signed)
Physical Therapy Treatment Patient Details Name: Kenneth Wagner MRN: [id number] DOB: Apr 13, 1946 Today's Date: 12/13/2016    History of Present Illness Pt is a 71 y/o male s/p L THA, anterior hip precautions. PMH includes HTN, CAD s/p cardiac cath, R shoulder impingement, and bilat TKA.     PT Comments    Pt demonstrates improved tolerance for LE strengthening exercises and good adherence to hip precautions with all mobility. Performed gait x increased distance and improved sequencing with RW. Pt will need to perform stair negotiation prior to DC home this afternoon.     Follow Up Recommendations  DC plan and follow up therapy as arranged by surgeon;Supervision/Assistance - 24 hour     Equipment Recommendations  None recommended by PT    Recommendations for Other Services       Precautions / Restrictions Precautions Precautions: Anterior Hip Precaution Booklet Issued: Yes (comment) Precaution Comments: Reviewed anterior precautions and THA exercises  Restrictions Weight Bearing Restrictions: Yes RLE Weight Bearing: Weight bearing as tolerated    Mobility  Bed Mobility Overal bed mobility: Needs Assistance Bed Mobility: Supine to Sit     Supine to sit: Supervision     General bed mobility comments: cues for maintaining hip precautions getting out on right side of bed  Transfers Overall transfer level: Needs assistance Equipment used: Rolling walker (2 wheeled) Transfers: Sit to/from Stand Sit to Stand: Min guard         General transfer comment: Min guard for safety. Verbal cues for safe hand placement.   Ambulation/Gait Ambulation/Gait assistance: Min guard Ambulation Distance (Feet): 250 Feet Assistive device: Rolling walker (2 wheeled) Gait Pattern/deviations: Step-to pattern;Step-through pattern;Decreased step length - left;Decreased stance time - right;Antalgic Gait velocity: Decreased Gait velocity interpretation: Below normal speed for  age/gender General Gait Details: moderate antalgic gait with cues for sequencing and to decrease reliance on RW. Pt is able to perform swing-through sequencing following gait   Stairs            Wheelchair Mobility    Modified Rankin (Stroke Patients Only)       Balance Overall balance assessment: Needs assistance Sitting-balance support: No upper extremity supported;Feet supported Sitting balance-Leahy Scale: Good     Standing balance support: Bilateral upper extremity supported;During functional activity Standing balance-Leahy Scale: Poor Standing balance comment: Reliant on RW for stability                             Cognition Arousal/Alertness: Awake/alert Behavior During Therapy: WFL for tasks assessed/performed Overall Cognitive Status: Within Functional Limits for tasks assessed                                        Exercises Total Joint Exercises Ankle Circles/Pumps: AROM;Both;20 reps;Supine Quad Sets: AROM;Right;10 reps;Supine Short Arc Quad: AROM;Right;10 reps;Supine Heel Slides: AROM;Right;10 reps;Supine    General Comments        Pertinent Vitals/Pain Pain Assessment: 0-10 Pain Score: 2  Pain Location: R hip  Pain Descriptors / Indicators: Aching;Operative site guarding Pain Intervention(s): Monitored during session;Premedicated before session;Repositioned;Ice applied    Home Living                      Prior Function            PT Goals (current goals can now be found in the  care plan section) Acute Rehab PT Goals Patient Stated Goal: to go home  Progress towards PT goals: Progressing toward goals    Frequency    7X/week      PT Plan Current plan remains appropriate    Co-evaluation              AM-PAC PT "6 Clicks" Daily Activity  Outcome Measure  Difficulty turning over in bed (including adjusting bedclothes, sheets and blankets)?: None Difficulty moving from lying on back to  sitting on the side of the bed? : A Little Difficulty sitting down on and standing up from a chair with arms (e.g., wheelchair, bedside commode, etc,.)?: Total Help needed moving to and from a bed to chair (including a wheelchair)?: A Little Help needed walking in hospital room?: A Little Help needed climbing 3-5 steps with a railing? : A Little 6 Click Score: 17    End of Session Equipment Utilized During Treatment: Gait belt Activity Tolerance: Patient tolerated treatment well Patient left: in chair;with call bell/phone within reach;with family/visitor present Nurse Communication: Mobility status PT Visit Diagnosis: Other abnormalities of gait and mobility (R26.89);Pain Pain - Right/Left: Right Pain - part of body: Hip     Time: 5093-2671 PT Time Calculation (min) (ACUTE ONLY): 32 min  Charges:  $Gait Training: 8-22 mins $Therapeutic Exercise: 8-22 mins                    G Codes:       Kenneth Wagner PT, DPT  [phone]    Kenneth Wagner 12/13/2016, 10:01 AM

## 2016-12-13 NOTE — Progress Notes (Addendum)
Physical Therapy Treatment Patient Details Name: Kenneth Wagner MRN: [id number] DOB: 19-Dec-1945 Today's Date: 12/13/2016    History of Present Illness Pt is a 71 y/o male s/p L THA, anterior hip precautions. PMH includes HTN, CAD s/p cardiac cath, R shoulder impingement, and bilat TKA.     PT Comments    Pt continues to make excellent progress with acute therapy follow-up. Performed stair negotiation with no increased c/o discomfort in right hip. Wife is present to educate and is able to safely demonstrate stair negotiation backwards with RW. Reviewed all seated and standing exercises in HEP. Pt will benefit from therapy follow-up at discharge per MD recommendations.     Follow Up Recommendations  DC plan and follow up therapy as arranged by surgeon;Supervision/Assistance - 24 hour     Equipment Recommendations  None recommended by PT    Recommendations for Other Services       Precautions / Restrictions Precautions Precautions: Anterior Hip Precaution Booklet Issued: Yes (comment) Precaution Comments: Pt able to recall 3/4 precautions this session. Hand out reviewed Restrictions Weight Bearing Restrictions: Yes RLE Weight Bearing: Weight bearing as tolerated    Mobility  Bed Mobility Overal bed mobility: Needs Assistance Bed Mobility: Supine to Sit     Supine to sit: Supervision     General bed mobility comments: Pt sitting up in recliner when PT arrives  Transfers Overall transfer level: Needs assistance Equipment used: Rolling walker (2 wheeled) Transfers: Sit to/from Stand Sit to Stand: Supervision         General transfer comment: supervision bordering on Mod I  Ambulation/Gait Ambulation/Gait assistance: Supervision Ambulation Distance (Feet): 500 Feet Assistive device: Rolling walker (2 wheeled) Gait Pattern/deviations: Step-through pattern;Antalgic Gait velocity: Decreased Gait velocity interpretation: Below normal speed for age/gender General  Gait Details: Mild antalgic gait this session. Improved sequencing and step length noted bilaterally   Stairs Stairs: Yes   Stair Management: No rails;Backwards;With walker;Step to pattern Number of Stairs: 4 General stair comments: pt ascend and descend 4 steps with RW. Min A to stabilize walker with stair negotiation. Wife present to assist and perform mobility  Wheelchair Mobility    Modified Rankin (Stroke Patients Only)       Balance Overall balance assessment: Needs assistance Sitting-balance support: No upper extremity supported;Feet supported Sitting balance-Leahy Scale: Good     Standing balance support: No upper extremity supported Standing balance-Leahy Scale: Fair Standing balance comment: able to stand without UE support                             Cognition Arousal/Alertness: Awake/alert Behavior During Therapy: WFL for tasks assessed/performed Overall Cognitive Status: Within Functional Limits for tasks assessed                                        Exercises Total Joint Exercises Ankle Circles/Pumps: AROM;Both;20 reps;Supine Quad Sets: AROM;Right;10 reps;Supine Short Arc Quad: AROM;Right;10 reps;Supine Heel Slides: AROM;Right;10 reps;Supine Long Arc Quad: AROM;Right;10 reps;Seated Marching in Standing: AROM;Right;10 reps;Standing    General Comments        Pertinent Vitals/Pain Pain Assessment: 0-10 Pain Score: 7  Pain Location: R hip with mobility Pain Descriptors / Indicators: Aching;Operative site guarding Pain Intervention(s): Monitored during session;Premedicated before session;Repositioned    Home Living  Prior Function            PT Goals (current goals can now be found in the care plan section) Acute Rehab PT Goals Patient Stated Goal: to go home  Progress towards PT goals: Progressing toward goals    Frequency    7X/week      PT Plan Current plan remains  appropriate    Co-evaluation              AM-PAC PT "6 Clicks" Daily Activity  Outcome Measure  Difficulty turning over in bed (including adjusting bedclothes, sheets and blankets)?: None Difficulty moving from lying on back to sitting on the side of the bed? : None Difficulty sitting down on and standing up from a chair with arms (e.g., wheelchair, bedside commode, etc,.)?: A Little Help needed moving to and from a bed to chair (including a wheelchair)?: A Little Help needed walking in hospital room?: A Little Help needed climbing 3-5 steps with a railing? : A Little 6 Click Score: 20    End of Session Equipment Utilized During Treatment: Gait belt Activity Tolerance: Patient tolerated treatment well Patient left: in chair;with call bell/phone within reach;with family/visitor present Nurse Communication: Mobility status PT Visit Diagnosis: Other abnormalities of gait and mobility (R26.89);Pain Pain - Right/Left: Right Pain - part of body: Hip     Time: 1218-1242 PT Time Calculation (min) (ACUTE ONLY): 24 min  Charges:  $Gait Training: 8-22 mins $Therapeutic Exercise: 8-22 mins                    G Codes:       Scheryl Marten PT, DPT  [phone]    Jacqulyn Liner Sloan Leiter 12/13/2016, 12:58 PM

## 2016-12-13 NOTE — Care Management Note (Signed)
Case Management Note  Patient Details  Name: Kenneth Wagner MRN: [id number] Date of Birth: [birth year]  Subjective/Objective:    71 yr old gentleman s/p right total hip arthroplasty.                Action/Plan: Case manager spoke with patient and his wife concerning discharge plan and DME needs. Patient was preoperatively setup with [facility], no changes. He already has RW and 3in1 at home. Will have family support at discharge.  Expected Discharge Date:  12/14/16               Expected Discharge Plan:  [place]  In-House Referral:  NA  Discharge planning Services  CM Consult  Post Acute Care Choice:  Home Health Choice offered to:  Patient, Spouse  DME Arranged:   (Has RW and 3in1) DME Agency:  NA  HH Arranged:  PT [place] Agency:  [facility] (formerly [employer])  Status of Service:  Completed, signed off  If discussed at [employer], dates discussed:    Additional Comments:  Ninfa Meeker, RN 12/13/2016, 12:48 PM

## 2016-12-13 NOTE — Op Note (Signed)
NAME:  Kenneth Wagner, Kenneth Wagner NO.:  [id number]  MEDICAL RECORD NO.:  [id number]  LOCATION:                                 FACILITY:  PHYSICIAN:  [name], M.D.      DATE OF BIRTH:  DATE OF PROCEDURE:  12/12/2016 DATE OF DISCHARGE:                              OPERATIVE REPORT   PREOPERATIVE DIAGNOSES:  Right hip end-stage arthritis, primary, localized.  POSTOPERATIVE DIAGNOSIS:  Right hip end-stage arthritis, primary, localized.  PROCEDURE:  Direct anterior right total hip replacement Stryker prosthesis.  Press-fit #6 Accolate stem.  Press-fit 54 mm cup screw fixation x2.  A 36 mm internal diameter liner.  Femoral head Biolox 36+ 0.  SURGEON:  [name], M.D.  ASSISTANT:  Elmyra Ricks, PA, present throughout the entire case and necessary for timely completion of procedure.  ANESTHESIA:  Spinal.  BLOOD LOSS:  150 mL.  BLOOD GIVEN:  None.  SPECIMENS:  None.  CULTURES:  None.  COMPLICATIONS:  None.  DRESSINGS:  Sterile compressive.  DESCRIPTION OF PROCEDURE:  The patient was brought to the operating room and after adequate anesthesia had been obtained, placed on the Hana bed, appropriate padding.  Prepped and draped in usual sterile fashion.  A longitudinal incision just distal and posterior to the ASIS.  Retractors put in place.  Fascia over the tensor incised.  Retractors put in place. Deep tissue capsule excised.  Femoral neck cut 1 fingerbreadth above the lesser trochanter.  Acetabulum exposed.  Brought up to good bleeding bone.  Fitted with a 54 mm cup at 40 degrees of abduction, slight anteversion.  Fluoroscopic guidance.  Two screws through the cup.  A 36 mm internal diameter liner.  With appropriate positioning and traction, femur was brought up to sizing and fitting for #6 Accolade stem.  After trials, I used a 36+ 0 head.  With this construct and the hip reduced, equal leg lengths, good stability.  Wound  irrigated, injected with Exparel.  Fascia closed with Vicryl.  Subcutaneous and subcuticular closure.  Margins were injected with Marcaine.  Sterile compressive dressing applied.  Anesthesia reversed.  Brought to the recovery room.  Tolerated the surgery well.  No complications.     [name], M.D.   ______________________________ [name], M.D.    DFM/MEDQ  D:  12/12/2016  T:  12/12/2016  Job:  [id number]

## 2016-12-13 NOTE — OR Nursing (Signed)
Implant "Biolox Delta Ceramic V40 Femoral Head" added to "Implants" section of operative record for Total Hip Arthroplasty procedure performed on 12/12/2016. This particular implant was signed off as having been implanted via the vendor charge sheet. The signing RN was Pia Mau, who was the circulator for the case.

## 2016-12-14 ENCOUNTER — Encounter (HOSPITAL_COMMUNITY): Payer: Self-pay | Admitting: Orthopedic Surgery

## 2016-12-14 DIAGNOSIS — K449 Diaphragmatic hernia without obstruction or gangrene: Secondary | ICD-10-CM | POA: Diagnosis not present

## 2016-12-14 DIAGNOSIS — Z9181 History of falling: Secondary | ICD-10-CM | POA: Diagnosis not present

## 2016-12-14 DIAGNOSIS — Z471 Aftercare following joint replacement surgery: Secondary | ICD-10-CM | POA: Diagnosis not present

## 2016-12-14 DIAGNOSIS — M7541 Impingement syndrome of right shoulder: Secondary | ICD-10-CM | POA: Diagnosis not present

## 2016-12-14 DIAGNOSIS — Z96653 Presence of artificial knee joint, bilateral: Secondary | ICD-10-CM | POA: Diagnosis not present

## 2016-12-14 DIAGNOSIS — Z87891 Personal history of nicotine dependence: Secondary | ICD-10-CM | POA: Diagnosis not present

## 2016-12-14 DIAGNOSIS — Z7982 Long term (current) use of aspirin: Secondary | ICD-10-CM | POA: Diagnosis not present

## 2016-12-14 DIAGNOSIS — I2510 Atherosclerotic heart disease of native coronary artery without angina pectoris: Secondary | ICD-10-CM | POA: Diagnosis not present

## 2016-12-14 DIAGNOSIS — I10 Essential (primary) hypertension: Secondary | ICD-10-CM | POA: Diagnosis not present

## 2016-12-14 DIAGNOSIS — Z96641 Presence of right artificial hip joint: Secondary | ICD-10-CM | POA: Diagnosis not present

## 2016-12-17 DIAGNOSIS — Z96641 Presence of right artificial hip joint: Secondary | ICD-10-CM | POA: Diagnosis not present

## 2016-12-17 DIAGNOSIS — I2510 Atherosclerotic heart disease of native coronary artery without angina pectoris: Secondary | ICD-10-CM | POA: Diagnosis not present

## 2016-12-17 DIAGNOSIS — Z9181 History of falling: Secondary | ICD-10-CM | POA: Diagnosis not present

## 2016-12-17 DIAGNOSIS — M7541 Impingement syndrome of right shoulder: Secondary | ICD-10-CM | POA: Diagnosis not present

## 2016-12-17 DIAGNOSIS — K449 Diaphragmatic hernia without obstruction or gangrene: Secondary | ICD-10-CM | POA: Diagnosis not present

## 2016-12-17 DIAGNOSIS — Z87891 Personal history of nicotine dependence: Secondary | ICD-10-CM | POA: Diagnosis not present

## 2016-12-17 DIAGNOSIS — I10 Essential (primary) hypertension: Secondary | ICD-10-CM | POA: Diagnosis not present

## 2016-12-17 DIAGNOSIS — Z96653 Presence of artificial knee joint, bilateral: Secondary | ICD-10-CM | POA: Diagnosis not present

## 2016-12-17 DIAGNOSIS — Z7982 Long term (current) use of aspirin: Secondary | ICD-10-CM | POA: Diagnosis not present

## 2016-12-17 DIAGNOSIS — Z471 Aftercare following joint replacement surgery: Secondary | ICD-10-CM | POA: Diagnosis not present

## 2016-12-20 DIAGNOSIS — M1611 Unilateral primary osteoarthritis, right hip: Secondary | ICD-10-CM | POA: Diagnosis not present

## 2016-12-22 DIAGNOSIS — Z96641 Presence of right artificial hip joint: Secondary | ICD-10-CM | POA: Diagnosis not present

## 2016-12-25 DIAGNOSIS — Z96641 Presence of right artificial hip joint: Secondary | ICD-10-CM | POA: Diagnosis not present

## 2016-12-31 DIAGNOSIS — Z96641 Presence of right artificial hip joint: Secondary | ICD-10-CM | POA: Diagnosis not present

## 2017-01-03 DIAGNOSIS — H6123 Impacted cerumen, bilateral: Secondary | ICD-10-CM | POA: Diagnosis not present

## 2017-01-03 DIAGNOSIS — H7312 Chronic myringitis, left ear: Secondary | ICD-10-CM | POA: Diagnosis not present

## 2017-01-03 DIAGNOSIS — H6062 Unspecified chronic otitis externa, left ear: Secondary | ICD-10-CM | POA: Diagnosis not present

## 2017-01-03 DIAGNOSIS — H903 Sensorineural hearing loss, bilateral: Secondary | ICD-10-CM | POA: Diagnosis not present

## 2017-01-08 DIAGNOSIS — Z96641 Presence of right artificial hip joint: Secondary | ICD-10-CM | POA: Diagnosis not present

## 2017-01-22 DIAGNOSIS — L304 Erythema intertrigo: Secondary | ICD-10-CM | POA: Diagnosis not present

## 2017-01-22 DIAGNOSIS — L740 Miliaria rubra: Secondary | ICD-10-CM | POA: Diagnosis not present

## 2017-01-25 DIAGNOSIS — H524 Presbyopia: Secondary | ICD-10-CM | POA: Diagnosis not present

## 2017-01-25 DIAGNOSIS — H52223 Regular astigmatism, bilateral: Secondary | ICD-10-CM | POA: Diagnosis not present

## 2017-01-25 DIAGNOSIS — H5203 Hypermetropia, bilateral: Secondary | ICD-10-CM | POA: Diagnosis not present

## 2017-01-25 DIAGNOSIS — H40051 Ocular hypertension, right eye: Secondary | ICD-10-CM | POA: Diagnosis not present

## 2017-01-25 DIAGNOSIS — H2513 Age-related nuclear cataract, bilateral: Secondary | ICD-10-CM | POA: Diagnosis not present

## 2017-02-14 ENCOUNTER — Encounter (HOSPITAL_BASED_OUTPATIENT_CLINIC_OR_DEPARTMENT_OTHER): Payer: Self-pay | Admitting: *Deleted

## 2017-02-18 NOTE — H&P (Signed)
This is a pleasant 71 year-old gentleman who presents to our clinic today for follow up of his right hip and right shoulder.  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  Scheduled for a total hip replacement in July.  Continuing to have marked pain and functional limitations, but is trying to do as best he can until surgery.  The other issue is his right shoulder.  History of likely subacromial bursitis/rotator cuff tendonitis.  We saw him back at the end of March where we proceeded with a subacromial injection.  Significant relief, but only lasted for one week. Past medical, social and family history reviewed in detail on the patient questionnaire and signed.  Review of systems: As detailed in HPI.  All others reviewed and are negative.   EXAMINATION: Well-developed, well-nourished gentleman in no acute distress.  Alert and oriented x 3.  Examination of his right hip reveals markedly positive log roll.  Tenderness over the greater trochanter.  Negative straight leg raise.  His shoulder has near full range of motion.  Markedly positive empty can and cross body.  Negative drop arm.    IMPRESSION: 1. Right hip DJD. 2. Right shoulder rotator cuff tendonitis versus tear.  PLAN: In regards to his right hip, continue his current plan to proceed with total hip replacement in July.  In regards to his right shoulder, we are going to get an MRI to assess his rotator cuff.  He will call once this is completed.  If there is not a tear we are happy to ignore this for the time being.    Addendum: MRI right shoulder reveals a rotator cuff tear.  We will proceed with right shoulder arthroscopic decompression and rotator cuff repair.  Risks, benefits and possible complications reviewed.  Rehab and recovery time discussed.  All questions answered.

## 2017-02-20 ENCOUNTER — Encounter (HOSPITAL_BASED_OUTPATIENT_CLINIC_OR_DEPARTMENT_OTHER): Payer: Self-pay | Admitting: Certified Registered"

## 2017-02-20 NOTE — Anesthesia Preprocedure Evaluation (Signed)
Anesthesia Evaluation  Patient identified by MRN, date of birth, ID band Patient awake    Reviewed: Allergy & Precautions, H&P , NPO status , Patient's Chart, lab work & pertinent test results  Airway Mallampati: I  TM Distance: >3 FB Neck ROM: Full    Dental no notable dental hx.    Pulmonary neg pulmonary ROS, former smoker,    Pulmonary exam normal breath sounds clear to auscultation       Cardiovascular hypertension, + CAD  Normal cardiovascular exam Rhythm:Regular Rate:Normal     Neuro/Psych negative neurological ROS  negative psych ROS   GI/Hepatic Neg liver ROS, hiatal hernia,   Endo/Other  negative endocrine ROS  Renal/GU negative Renal ROS  negative genitourinary   Musculoskeletal negative musculoskeletal ROS (+) Arthritis ,   Abdominal   Peds negative pediatric ROS (+)  Hematology negative hematology ROS (+)   Anesthesia Other Findings   Reproductive/Obstetrics negative OB ROS                             Lab Results  Component Value Date   WBC 11.8 (H) 12/13/2016   HGB 10.6 (L) 12/13/2016   HCT 31.1 (L) 12/13/2016   MCV 94.0 12/13/2016   PLT 190 12/13/2016   Lab Results  Component Value Date   CREATININE 0.94 12/13/2016   BUN 9 12/13/2016   NA 136 12/13/2016   K 4.3 12/13/2016   CL 103 12/13/2016   CO2 27 12/13/2016    Anesthesia Physical  Anesthesia Plan  ASA: III  Anesthesia Plan: General   Post-op Pain Management:  Regional for Post-op pain   Induction: Intravenous  PONV Risk Score and Plan: 2 and Ondansetron and Dexamethasone  Airway Management Planned: Oral ETT  Additional Equipment:   Intra-op Plan:   Post-operative Plan: Extubation in OR  Informed Consent: I have reviewed the patients History and Physical, chart, labs and discussed the procedure including the risks, benefits and alternatives for the proposed anesthesia with the patient or  authorized representative who has indicated his/her understanding and acceptance.   Dental advisory given  Plan Discussed with: CRNA  Anesthesia Plan Comments: (  )        Anesthesia Quick Evaluation

## 2017-02-21 ENCOUNTER — Ambulatory Visit (HOSPITAL_BASED_OUTPATIENT_CLINIC_OR_DEPARTMENT_OTHER): Payer: PPO | Admitting: Certified Registered"

## 2017-02-21 ENCOUNTER — Encounter (HOSPITAL_BASED_OUTPATIENT_CLINIC_OR_DEPARTMENT_OTHER)
Admission: RE | Disposition: A | Discharge status: Home / Self Care | Payer: Self-pay | Source: Ambulatory Visit | Attending: Orthopedic Surgery

## 2017-02-21 ENCOUNTER — Encounter (HOSPITAL_BASED_OUTPATIENT_CLINIC_OR_DEPARTMENT_OTHER): Payer: Self-pay | Admitting: Certified Registered"

## 2017-02-21 ENCOUNTER — Ambulatory Visit (HOSPITAL_BASED_OUTPATIENT_CLINIC_OR_DEPARTMENT_OTHER)
Admission: RE | Admit: 2017-02-21 | Discharge: 2017-02-21 | Disposition: A | Discharge status: Home / Self Care | Payer: PPO | Source: Ambulatory Visit | Attending: Orthopedic Surgery | Admitting: Orthopedic Surgery

## 2017-02-21 DIAGNOSIS — M1611 Unilateral primary osteoarthritis, right hip: Secondary | ICD-10-CM | POA: Diagnosis not present

## 2017-02-21 DIAGNOSIS — M7541 Impingement syndrome of right shoulder: Secondary | ICD-10-CM | POA: Diagnosis not present

## 2017-02-21 DIAGNOSIS — K449 Diaphragmatic hernia without obstruction or gangrene: Secondary | ICD-10-CM | POA: Diagnosis not present

## 2017-02-21 DIAGNOSIS — I2510 Atherosclerotic heart disease of native coronary artery without angina pectoris: Secondary | ICD-10-CM | POA: Diagnosis not present

## 2017-02-21 DIAGNOSIS — I10 Essential (primary) hypertension: Secondary | ICD-10-CM | POA: Diagnosis not present

## 2017-02-21 DIAGNOSIS — M75121 Complete rotator cuff tear or rupture of right shoulder, not specified as traumatic: Secondary | ICD-10-CM | POA: Insufficient documentation

## 2017-02-21 DIAGNOSIS — M24111 Other articular cartilage disorders, right shoulder: Secondary | ICD-10-CM | POA: Diagnosis not present

## 2017-02-21 DIAGNOSIS — Z79899 Other long term (current) drug therapy: Secondary | ICD-10-CM | POA: Insufficient documentation

## 2017-02-21 DIAGNOSIS — M75101 Unspecified rotator cuff tear or rupture of right shoulder, not specified as traumatic: Secondary | ICD-10-CM | POA: Diagnosis not present

## 2017-02-21 DIAGNOSIS — G8918 Other acute postprocedural pain: Secondary | ICD-10-CM | POA: Diagnosis not present

## 2017-02-21 DIAGNOSIS — S46011A Strain of muscle(s) and tendon(s) of the rotator cuff of right shoulder, initial encounter: Secondary | ICD-10-CM | POA: Diagnosis not present

## 2017-02-21 DIAGNOSIS — M19011 Primary osteoarthritis, right shoulder: Secondary | ICD-10-CM | POA: Insufficient documentation

## 2017-02-21 HISTORY — PX: SHOULDER ARTHROSCOPY WITH ROTATOR CUFF REPAIR AND SUBACROMIAL DECOMPRESSION: SHX5686

## 2017-02-21 HISTORY — PX: RESECTION DISTAL CLAVICAL: SHX5053

## 2017-02-21 SURGERY — SHOULDER ARTHROSCOPY WITH ROTATOR CUFF REPAIR AND SUBACROMIAL DECOMPRESSION
Anesthesia: General | Site: Shoulder | Laterality: Right

## 2017-02-21 MED ORDER — LIDOCAINE 2% (20 MG/ML) 5 ML SYRINGE
INTRAMUSCULAR | Status: AC
  Filled 2017-02-21: qty 20

## 2017-02-21 MED ORDER — SODIUM CHLORIDE 0.9 % IR SOLN
Status: DC | PRN
  Administered 2017-02-21: 12000 mL

## 2017-02-21 MED ORDER — MIDAZOLAM HCL 2 MG/2ML IJ SOLN
INTRAMUSCULAR | Status: AC
  Filled 2017-02-21: qty 2

## 2017-02-21 MED ORDER — LIDOCAINE HCL (CARDIAC) 20 MG/ML IV SOLN
INTRAVENOUS | Status: DC | PRN
  Administered 2017-02-21: 30 mg via INTRAVENOUS

## 2017-02-21 MED ORDER — SUCCINYLCHOLINE CHLORIDE 20 MG/ML IJ SOLN
INTRAMUSCULAR | Status: DC | PRN
  Administered 2017-02-21: 100 mg via INTRAVENOUS

## 2017-02-21 MED ORDER — PROPOFOL 500 MG/50ML IV EMUL
INTRAVENOUS | Status: AC
  Filled 2017-02-21: qty 50

## 2017-02-21 MED ORDER — EPHEDRINE 5 MG/ML INJ
INTRAVENOUS | Status: AC
  Filled 2017-02-21: qty 10

## 2017-02-21 MED ORDER — CEFAZOLIN SODIUM-DEXTROSE 2-4 GM/100ML-% IV SOLN
2.0000 g | INTRAVENOUS | Status: AC
  Administered 2017-02-21: 2 g via INTRAVENOUS

## 2017-02-21 MED ORDER — FENTANYL CITRATE (PF) 100 MCG/2ML IJ SOLN
INTRAMUSCULAR | Status: AC
  Filled 2017-02-21: qty 2

## 2017-02-21 MED ORDER — PROPOFOL 10 MG/ML IV BOLUS
INTRAVENOUS | Status: AC
  Filled 2017-02-21: qty 20

## 2017-02-21 MED ORDER — CHLORHEXIDINE GLUCONATE 4 % EX LIQD: 60.0000 mL | Freq: Once | CUTANEOUS | Status: DC

## 2017-02-21 MED ORDER — GLYCOPYRROLATE 0.2 MG/ML IJ SOLN
INTRAMUSCULAR | Status: DC | PRN
  Administered 2017-02-21: 0.1 mg via INTRAVENOUS

## 2017-02-21 MED ORDER — HYDROCODONE-ACETAMINOPHEN 10-325 MG PO TABS: 1.0000 | ORAL_TABLET | ORAL | 0 refills | Status: DC | PRN

## 2017-02-21 MED ORDER — DEXAMETHASONE SODIUM PHOSPHATE 4 MG/ML IJ SOLN
INTRAMUSCULAR | Status: DC | PRN
  Administered 2017-02-21: 10 mg via INTRAVENOUS

## 2017-02-21 MED ORDER — EPHEDRINE SULFATE 50 MG/ML IJ SOLN
INTRAMUSCULAR | Status: DC | PRN
  Administered 2017-02-21: 10 mg via INTRAVENOUS

## 2017-02-21 MED ORDER — SCOPOLAMINE 1 MG/3DAYS TD PT72: 1.0000 | MEDICATED_PATCH | Freq: Once | TRANSDERMAL | Status: DC | PRN

## 2017-02-21 MED ORDER — BUPIVACAINE-EPINEPHRINE (PF) 0.5% -1:200000 IJ SOLN
INTRAMUSCULAR | Status: DC | PRN
  Administered 2017-02-21: 25 mL via PERINEURAL

## 2017-02-21 MED ORDER — PROPOFOL 10 MG/ML IV BOLUS
INTRAVENOUS | Status: DC | PRN
  Administered 2017-02-21: 200 mg via INTRAVENOUS

## 2017-02-21 MED ORDER — CEFAZOLIN SODIUM-DEXTROSE 2-4 GM/100ML-% IV SOLN
INTRAVENOUS | Status: AC
  Filled 2017-02-21: qty 100

## 2017-02-21 MED ORDER — ONDANSETRON HCL 4 MG/2ML IJ SOLN
INTRAMUSCULAR | Status: AC
  Filled 2017-02-21: qty 8

## 2017-02-21 MED ORDER — LACTATED RINGERS IV SOLN
INTRAVENOUS | Status: DC
  Administered 2017-02-21 (×2): via INTRAVENOUS

## 2017-02-21 MED ORDER — ONDANSETRON HCL 4 MG/2ML IJ SOLN
INTRAMUSCULAR | Status: DC | PRN
  Administered 2017-02-21: 4 mg via INTRAVENOUS

## 2017-02-21 MED ORDER — DEXAMETHASONE SODIUM PHOSPHATE 10 MG/ML IJ SOLN
INTRAMUSCULAR | Status: AC
  Filled 2017-02-21: qty 2

## 2017-02-21 MED ORDER — FENTANYL CITRATE (PF) 100 MCG/2ML IJ SOLN
50.0000 ug | INTRAMUSCULAR | Status: DC | PRN
  Administered 2017-02-21: 50 ug via INTRAVENOUS
  Administered 2017-02-21: 100 ug via INTRAVENOUS

## 2017-02-21 MED ORDER — LACTATED RINGERS IV SOLN
INTRAVENOUS | Status: DC
Start: 2017-02-21 — End: 2017-02-21

## 2017-02-21 MED ORDER — ONDANSETRON HCL 4 MG PO TABS: 4.0000 mg | ORAL_TABLET | Freq: Three times a day (TID) | ORAL | 0 refills | Status: DC | PRN

## 2017-02-21 MED ORDER — MIDAZOLAM HCL 2 MG/2ML IJ SOLN
1.0000 mg | INTRAMUSCULAR | Status: DC | PRN
  Administered 2017-02-21: 2 mg via INTRAVENOUS

## 2017-02-21 SURGICAL SUPPLY — 69 items
ANCHOR SUT BIO SW 4.75X19.1 (Anchor) ×4 IMPLANT
BENZOIN TINCTURE PRP APPL 2/3 (GAUZE/BANDAGES/DRESSINGS) IMPLANT
BLADE CUTTER GATOR 3.5 (BLADE) ×2 IMPLANT
BLADE CUTTER MENIS 5.5 (BLADE) IMPLANT
BLADE GREAT WHITE 4.2 (BLADE) ×2 IMPLANT
BLADE SURG 15 STRL LF DISP TIS (BLADE) ×1 IMPLANT
BLADE SURG 15 STRL SS (BLADE) ×1
BUR OVAL 6.0 (BURR) ×2 IMPLANT
CANNULA DRY DOC 8X75 (CANNULA) ×2 IMPLANT
CANNULA TWIST IN 8.25X7CM (CANNULA) ×2 IMPLANT
DECANTER SPIKE VIAL GLASS SM (MISCELLANEOUS) IMPLANT
DRAPE STERI 35X30 U-POUCH (DRAPES) ×2 IMPLANT
DRAPE U-SHAPE 47X51 STRL (DRAPES) ×2 IMPLANT
DRAPE U-SHAPE 76X120 STRL (DRAPES) ×4 IMPLANT
DRSG PAD ABDOMINAL 8X10 ST (GAUZE/BANDAGES/DRESSINGS) ×2 IMPLANT
DURAPREP 26ML APPLICATOR (WOUND CARE) ×2 IMPLANT
ELECT MENISCUS 165MM 90D (ELECTRODE) ×2 IMPLANT
ELECT REM PT RETURN 9FT ADLT (ELECTROSURGICAL) ×2
ELECTRODE REM PT RTRN 9FT ADLT (ELECTROSURGICAL) ×1 IMPLANT
GAUZE SPONGE 4X4 12PLY STRL (GAUZE/BANDAGES/DRESSINGS) ×4 IMPLANT
GAUZE XEROFORM 1X8 LF (GAUZE/BANDAGES/DRESSINGS) ×2 IMPLANT
GLOVE BIOGEL PI IND STRL 7.0 (GLOVE) ×3 IMPLANT
GLOVE BIOGEL PI INDICATOR 7.0 (GLOVE) ×3
GLOVE ECLIPSE 6.5 STRL STRAW (GLOVE) ×2 IMPLANT
GLOVE ECLIPSE 7.0 STRL STRAW (GLOVE) ×2 IMPLANT
GLOVE SURG ORTHO 8.0 STRL STRW (GLOVE) ×2 IMPLANT
GOWN STRL REUS W/ TWL LRG LVL3 (GOWN DISPOSABLE) ×1 IMPLANT
GOWN STRL REUS W/ TWL XL LVL3 (GOWN DISPOSABLE) ×2 IMPLANT
GOWN STRL REUS W/TWL LRG LVL3 (GOWN DISPOSABLE) ×1
GOWN STRL REUS W/TWL XL LVL3 (GOWN DISPOSABLE) ×2
IV NS IRRIG 3000ML ARTHROMATIC (IV SOLUTION) ×6 IMPLANT
MANIFOLD NEPTUNE II (INSTRUMENTS) ×2 IMPLANT
NDL SUT 6 .5 CRC .975X.05 MAYO (NEEDLE) IMPLANT
NEEDLE MAYO TAPER (NEEDLE)
NEEDLE SCORPION MULTI FIRE (NEEDLE) ×2 IMPLANT
NS IRRIG 1000ML POUR BTL (IV SOLUTION) IMPLANT
PACK ARTHROSCOPY DSU (CUSTOM PROCEDURE TRAY) ×2 IMPLANT
PASSER SUT SWANSON 36MM LOOP (INSTRUMENTS) IMPLANT
PENCIL BUTTON HOLSTER BLD 10FT (ELECTRODE) ×2 IMPLANT
RESTRAINT HEAD UNIVERSAL NS (MISCELLANEOUS) ×2 IMPLANT
SET ARTHROSCOPY TUBING (MISCELLANEOUS) ×1
SET ARTHROSCOPY TUBING LN (MISCELLANEOUS) ×1 IMPLANT
SLEEVE SCD COMPRESS KNEE MED (MISCELLANEOUS) ×2 IMPLANT
SLING ARM FOAM STRAP LRG (SOFTGOODS) IMPLANT
SLING ARM IMMOBILIZER LRG (SOFTGOODS) ×2 IMPLANT
SLING ARM IMMOBILIZER MED (SOFTGOODS) IMPLANT
SLING ARM MED ADULT FOAM STRAP (SOFTGOODS) IMPLANT
SLING ARM XL FOAM STRAP (SOFTGOODS) IMPLANT
SPONGE LAP 4X18 X RAY DECT (DISPOSABLE) IMPLANT
STRIP CLOSURE SKIN 1/2X4 (GAUZE/BANDAGES/DRESSINGS) IMPLANT
SUCTION FRAZIER HANDLE 10FR (MISCELLANEOUS)
SUCTION TUBE FRAZIER 10FR DISP (MISCELLANEOUS) IMPLANT
SUT ETHIBOND 2 OS 4 DA (SUTURE) IMPLANT
SUT ETHILON 2 0 FS 18 (SUTURE) IMPLANT
SUT ETHILON 3 0 PS 1 (SUTURE) ×2 IMPLANT
SUT FIBERWIRE #2 38 T-5 BLUE (SUTURE)
SUT RETRIEVER MED (INSTRUMENTS) IMPLANT
SUT TIGER TAPE 7 IN WHITE (SUTURE) ×2 IMPLANT
SUT VIC AB 0 CT1 27 (SUTURE)
SUT VIC AB 0 CT1 27XBRD ANBCTR (SUTURE) IMPLANT
SUT VIC AB 2-0 SH 27 (SUTURE)
SUT VIC AB 2-0 SH 27XBRD (SUTURE) IMPLANT
SUT VIC AB 3-0 FS2 27 (SUTURE) IMPLANT
SUTURE FIBERWR #2 38 T-5 BLUE (SUTURE) IMPLANT
TAPE FIBER 2MM 7IN #2 BLUE (SUTURE) ×2 IMPLANT
TOWEL OR 17X24 6PK STRL BLUE (TOWEL DISPOSABLE) ×2 IMPLANT
TOWEL OR NON WOVEN STRL DISP B (DISPOSABLE) ×2 IMPLANT
WATER STERILE IRR 1000ML POUR (IV SOLUTION) ×2 IMPLANT
YANKAUER SUCT BULB TIP NO VENT (SUCTIONS) IMPLANT

## 2017-02-21 NOTE — Transfer of Care (Signed)
Immediate Anesthesia Transfer of Care Note  Patient: Kenneth Wagner  Procedure(s) Performed: Procedure(s): RIGHT SHOULDER ARTHROSCOPY WITH DEBRIDEMENT, ACROMIOPLASTY, ROTATOR CUFF REPAIR (Right) DISTAL CLAVICLE EXCISION (Right)  Patient Location: PACU  Anesthesia Type:GA combined with regional for post-op pain  Level of Consciousness: awake, alert , oriented and patient cooperative  Airway & Oxygen Therapy: Patient Spontanous Breathing and Patient connected to face mask oxygen  Post-op Assessment: Report given to RN and Post -op Vital signs reviewed and stable  Post vital signs: Reviewed and stable  Last Vitals:  Vitals:   02/21/17 0735 02/21/17 0740  BP: (!) 150/75   Pulse: 60 63  Resp: 16 19  Temp:    SpO2: 96% 96%    Last Pain:  Vitals:   02/21/17 0716  TempSrc:   PainSc: 0-No pain         Complications: No apparent anesthesia complications

## 2017-02-21 NOTE — Op Note (Signed)
Kenneth Wagner, Kenneth Wagner NO.:  [id number]  MEDICAL RECORD NO.:  [id number]  LOCATION:                                 FACILITY:  PHYSICIAN:  [name], M.D.      DATE OF BIRTH:  DATE OF PROCEDURE:  02/21/2017 DATE OF DISCHARGE:                              OPERATIVE REPORT   PREOPERATIVE DIAGNOSES:  Right shoulder complete rotator cuff tear, supraspinatus tendon.  Impingement.  Degenerative joint disease, acromioclavicular joint.  POSTOPERATIVE DIAGNOSES:  Right shoulder complete rotator cuff tear, supraspinatus tendon.  Impingement.  Degenerative joint disease, acromioclavicular joint.  Relatively extensive circumferential labral tears.  Grade 2 to 3 mild degenerative changes of glenohumeral joint.  PROCEDURE:  Right shoulder exam under anesthesia, arthroscopy. Debridement of glenohumeral joint with chondroplasty.  Debridement of labrum.  Debridement of mobilization of rotator cuff.  Bursectomy. Acromioplasty.  Coracoacromial ligament release.  Excision of distal clavicle.  Arthroscopic-assisted rotator cuff repair with FiberWire suture x2, SwiveLock anchors x2.  SURGEON:  [name], M.D.  ASSISTANT:  Tawanna Cooler, PA, present throughout the entire case and necessary for timely completion of procedure.  ANESTHESIA:  General.  ESTIMATED BLOOD LOSS:  Minimal.  SPECIMENS:  None.  CULTURES:  None.  COMPLICATIONS:  None.  DRESSINGS:  Soft compressive shoulder immobilizer.  DESCRIPTION OF PROCEDURE:  The patient was brought to the operating room.  After adequate anesthesia had been obtained, shoulder examined. A little tight, but full motion stable shoulder.  Placed in beach-chair position on the shoulder positioner, prepped and draped in usual sterile fashion.  Three portals, anterior, posterior, and lateral.  Arthroscope was introduced.  Shoulder distended and inspected.  Glenohumeral joint chondroplasty for some mild changes.   Debridement of extensive circumferential tears of labrum.  Biceps tendon had some mild changes, but not enough to warrant tenodesis or release.  Full-thickness tear of supraspinatus.  Retracted, but able to be mobilized.  Tuberosity roughened.  Cannula redirected subacromially.  Bursa resected. Acromioplasty from type 3 to type 1 acromion.  Grade 4 changes of spurring AC joint.  Periarticular spurs, lateral centimeter of clavicle resected.  I added a fourth anterolateral portal to facilitate getting above the clavicle.  Cannula was then placed in the lateral portal. With the Scorpion device, the cuff was captured with two horizontal mattress sutures with FiberWire suture.  Pulled over and firmly anchored down into the tuberosity with 2 prepunched SwiveLock anchors.  At completion, nice, firm, watertight closure achieved.  Adequacy of decompression confirmed.  Instruments and fluid removed.  Portals were closed with nylon.  Sterile compressive dressing applied.  Shoulder immobilizer applied.  Anesthesia reversed.  Brought to the recovery room.  Tolerated the surgery well.  No complications.     [name], M.D.   ______________________________ [name], M.D.    DFM/MEDQ  D:  02/21/2017  T:  02/21/2017  Job:  [id number]

## 2017-02-21 NOTE — Anesthesia Procedure Notes (Signed)
Procedure Name: Intubation Date/Time: 02/21/2017 7:47 AM Performed by: Santiana Glidden, Frank D Pre-anesthesia Checklist: Patient identified, Emergency Drugs available, Suction available and Patient being monitored Patient Re-evaluated:Patient Re-evaluated prior to induction Oxygen Delivery Method: Circle system utilized Preoxygenation: Pre-oxygenation with 100% oxygen Induction Type: IV induction Ventilation: Mask ventilation without difficulty Laryngoscope Size: Mac and 4 Grade View: Grade II Tube type: Oral Tube size: 7.0 mm Number of attempts: 1 Airway Equipment and Method: Stylet and Oral airway Placement Confirmation: ETT inserted through vocal cords under direct vision,  positive ETCO2 and breath sounds checked- equal and bilateral Secured at: 24 cm Tube secured with: Tape Dental Injury: Teeth and Oropharynx as per pre-operative assessment

## 2017-02-21 NOTE — Anesthesia Procedure Notes (Signed)
Anesthesia Regional Block: Interscalene brachial plexus block   Pre-Anesthetic Checklist: ,, timeout performed, Correct Patient, Correct Site, Correct Laterality, Correct Procedure, Correct Position, site marked, Risks and benefits discussed,  Surgical consent,  Pre-op evaluation,  At surgeon's request and post-op pain management  Laterality: Right  Prep: chloraprep       Needles:  Injection technique: Single-shot  Needle Type: Echogenic Stimulator Needle     Needle Length: 9cm  Needle Gauge: 21     Additional Needles:   Procedures:, nerve stimulator,,, ultrasound used (permanent image in chart),,,,   Nerve Stimulator or Paresthesia:  Response: deltoid, 0.5 mA,   Additional Responses:   Narrative:  Start time: 02/21/2017 7:10 AM End time: 02/21/2017 7:17 AM Injection made incrementally with aspirations every 5 mL.  Performed by: Personally  Anesthesiologist: Suzette Battiest

## 2017-02-21 NOTE — Progress Notes (Signed)
Assisted Dr. Rodman Comp with right, ultrasound guided, interscalene  block. Side rails up, monitors on throughout procedure. See vital signs in flow sheet. Tolerated Procedure well.

## 2017-02-21 NOTE — Anesthesia Postprocedure Evaluation (Signed)
Anesthesia Post Note  Patient: Kenneth Wagner  Procedure(s) Performed: Procedure(s) (LRB): RIGHT SHOULDER ARTHROSCOPY WITH DEBRIDEMENT, ACROMIOPLASTY, ROTATOR CUFF REPAIR (Right) DISTAL CLAVICLE EXCISION (Right)     Patient location during evaluation: PACU Anesthesia Type: General Level of consciousness: awake and alert Pain management: pain level controlled Vital Signs Assessment: post-procedure vital signs reviewed and stable Respiratory status: spontaneous breathing, nonlabored ventilation, respiratory function stable and patient connected to nasal cannula oxygen Cardiovascular status: blood pressure returned to baseline and stable Postop Assessment: no apparent nausea or vomiting Anesthetic complications: no    Last Vitals:  Vitals:   02/21/17 0945 02/21/17 1000  BP: (!) 144/85 (!) 145/84  Pulse: 69 64  Resp: 17 15  Temp:    SpO2: 97% 96%    Last Pain:  Vitals:   02/21/17 1000  TempSrc:   PainSc: 0-No pain                 Tiajuana Amass

## 2017-02-21 NOTE — Discharge Instructions (Signed)
Post Anesthesia Home Care Instructions  Activity: Get plenty of rest for the remainder of the day. A responsible individual must stay with you for 24 hours following the procedure.  For the next 24 hours, DO NOT: -Drive a car -Paediatric nurse -Drink alcoholic beverages -Take any medication unless instructed by your physician -Make any legal decisions or sign important papers.  Meals: Start with liquid foods such as gelatin or soup. Progress to regular foods as tolerated. Avoid greasy, spicy, heavy foods. If nausea and/or vomiting occur, drink only clear liquids until the nausea and/or vomiting subsides. Call your physician if vomiting continues.  Special Instructions/Symptoms: Your throat may feel dry or sore from the anesthesia or the breathing tube placed in your throat during surgery. If this causes discomfort, gargle with warm salt water. The discomfort should disappear within 24 hours.  If you had a scopolamine patch placed behind your ear for the management of post- operative nausea and/or vomiting:  1. The medication in the patch is effective for 72 hours, after which it should be removed.  Wrap patch in a tissue and discard in the trash. Wash hands thoroughly with soap and water. 2. You may remove the patch earlier than 72 hours if you experience unpleasant side effects which may include dry mouth, dizziness or visual disturbances. 3. Avoid touching the patch. Wash your hands with soap and water after contact with the patch.   Shouder arthroscopy, rotator cuff repair, subacromial decompression Care After Instructions Refer to this sheet in the next few weeks. These discharge instructions provide you with general information on caring for yourself after you leave the hospital. Your caregiver may also give you specific instructions. Your treatment has been planned according to the most current medical practices available, but unavoidable complications sometimes occur. If you have  any problems or questions after discharge, please call your caregiver. HOME INSTRUCTIONS You may resume a normal diet and activities as directed.  Take showers instead of baths until informed otherwise.  Change bandages (dressings) in 3 days.  Swab wounds daily with betadine.  Wash shoulder with soap and water.  Pat dry.  Cover wounds with bandaids. Only take over-the-counter or prescription medicines for pain, discomfort, or fever as directed by your caregiver.  Wear your sling for the next 6 weeks unless otherwise instructed. Eat a well-balanced diet.  Avoid lifting or driving until you are instructed otherwise.  Make an appointment to see your caregiver for stitches (suture) or staple removal one week after surgery.   SEEK MEDICAL CARE IF: You have swelling of your calf or leg.  You develop shortness of breath or chest pain.  You have redness, swelling, or increasing pain in the wound.  There is pus or any unusual drainage coming from the surgical site.  You notice a bad smell coming from the surgical site or dressing.  The surgical site breaks open after sutures or staples have been removed.  There is persistent bleeding from the suture or staple line.  You are getting worse or are not improving.  You have any other questions or concerns.  SEEK IMMEDIATE MEDICAL CARE IF:  You have a fever greater than 101 You develop a rash.  You have difficulty breathing.  You develop any reaction or side effects to medicines given.  Your knee motion is decreasing rather than improving.  MAKE SURE YOU:  Understand these instructions.  Will watch your condition.  Will get help right away if you are not doing well or get  worse.

## 2017-02-21 NOTE — Interval H&P Note (Signed)
History and Physical Interval Note:  02/21/2017 7:41 AM  Kenneth Wagner  has presented today for surgery, with the diagnosis of RIGHT SHOULDER OSTEOARTHRITIS, IMPINGEMENT SYNDROME, STRAIN OF MUSCLE AND TENDON OF THE ROTATOR CUFF   The various methods of treatment have been discussed with the patient and family. After consideration of risks, benefits and other options for treatment, the patient has consented to  Procedure(s): RIGHT SHOULDER ARTHROSCOPY WITH DEBRIDEMENT, ACROMIOPLASTY, DISTAL CLAVICAL EXCISION, ROTATOR CUFF REPAIR, BICEP TENODESIS. (Right) as a surgical intervention .  The patient's history has been reviewed, patient examined, no change in status, stable for surgery.  I have reviewed the patient's chart and labs.  Questions were answered to the patient's satisfaction.     [name]

## 2017-02-22 ENCOUNTER — Encounter (HOSPITAL_BASED_OUTPATIENT_CLINIC_OR_DEPARTMENT_OTHER): Payer: Self-pay | Admitting: Orthopedic Surgery

## 2017-03-01 DIAGNOSIS — M19011 Primary osteoarthritis, right shoulder: Secondary | ICD-10-CM | POA: Diagnosis not present

## 2017-03-05 DIAGNOSIS — S46011D Strain of muscle(s) and tendon(s) of the rotator cuff of right shoulder, subsequent encounter: Secondary | ICD-10-CM | POA: Diagnosis not present

## 2017-03-05 DIAGNOSIS — M25511 Pain in right shoulder: Secondary | ICD-10-CM | POA: Diagnosis not present

## 2017-03-05 DIAGNOSIS — M6281 Muscle weakness (generalized): Secondary | ICD-10-CM | POA: Diagnosis not present

## 2017-03-07 DIAGNOSIS — S46011D Strain of muscle(s) and tendon(s) of the rotator cuff of right shoulder, subsequent encounter: Secondary | ICD-10-CM | POA: Diagnosis not present

## 2017-03-07 DIAGNOSIS — M25511 Pain in right shoulder: Secondary | ICD-10-CM | POA: Diagnosis not present

## 2017-03-07 DIAGNOSIS — M6281 Muscle weakness (generalized): Secondary | ICD-10-CM | POA: Diagnosis not present

## 2017-03-11 DIAGNOSIS — S46011D Strain of muscle(s) and tendon(s) of the rotator cuff of right shoulder, subsequent encounter: Secondary | ICD-10-CM | POA: Diagnosis not present

## 2017-03-11 DIAGNOSIS — M25511 Pain in right shoulder: Secondary | ICD-10-CM | POA: Diagnosis not present

## 2017-03-11 DIAGNOSIS — M6281 Muscle weakness (generalized): Secondary | ICD-10-CM | POA: Diagnosis not present

## 2017-03-15 DIAGNOSIS — M6281 Muscle weakness (generalized): Secondary | ICD-10-CM | POA: Diagnosis not present

## 2017-03-15 DIAGNOSIS — M25511 Pain in right shoulder: Secondary | ICD-10-CM | POA: Diagnosis not present

## 2017-03-15 DIAGNOSIS — S46011D Strain of muscle(s) and tendon(s) of the rotator cuff of right shoulder, subsequent encounter: Secondary | ICD-10-CM | POA: Diagnosis not present

## 2017-03-18 DIAGNOSIS — S46011D Strain of muscle(s) and tendon(s) of the rotator cuff of right shoulder, subsequent encounter: Secondary | ICD-10-CM | POA: Diagnosis not present

## 2017-03-18 DIAGNOSIS — M6281 Muscle weakness (generalized): Secondary | ICD-10-CM | POA: Diagnosis not present

## 2017-03-18 DIAGNOSIS — M25511 Pain in right shoulder: Secondary | ICD-10-CM | POA: Diagnosis not present

## 2017-03-20 DIAGNOSIS — E7800 Pure hypercholesterolemia, unspecified: Secondary | ICD-10-CM | POA: Diagnosis not present

## 2017-03-20 DIAGNOSIS — Z125 Encounter for screening for malignant neoplasm of prostate: Secondary | ICD-10-CM | POA: Diagnosis not present

## 2017-03-20 DIAGNOSIS — Z0000 Encounter for general adult medical examination without abnormal findings: Secondary | ICD-10-CM | POA: Diagnosis not present

## 2017-03-20 DIAGNOSIS — Z23 Encounter for immunization: Secondary | ICD-10-CM | POA: Diagnosis not present

## 2017-03-20 DIAGNOSIS — Z1159 Encounter for screening for other viral diseases: Secondary | ICD-10-CM | POA: Diagnosis not present

## 2017-03-20 DIAGNOSIS — I10 Essential (primary) hypertension: Secondary | ICD-10-CM | POA: Diagnosis not present

## 2017-03-21 DIAGNOSIS — M6281 Muscle weakness (generalized): Secondary | ICD-10-CM | POA: Diagnosis not present

## 2017-03-21 DIAGNOSIS — M25511 Pain in right shoulder: Secondary | ICD-10-CM | POA: Diagnosis not present

## 2017-03-21 DIAGNOSIS — S46011D Strain of muscle(s) and tendon(s) of the rotator cuff of right shoulder, subsequent encounter: Secondary | ICD-10-CM | POA: Diagnosis not present

## 2017-03-25 DIAGNOSIS — M25511 Pain in right shoulder: Secondary | ICD-10-CM | POA: Diagnosis not present

## 2017-03-25 DIAGNOSIS — M6281 Muscle weakness (generalized): Secondary | ICD-10-CM | POA: Diagnosis not present

## 2017-03-25 DIAGNOSIS — S46011D Strain of muscle(s) and tendon(s) of the rotator cuff of right shoulder, subsequent encounter: Secondary | ICD-10-CM | POA: Diagnosis not present

## 2017-03-28 DIAGNOSIS — S46011D Strain of muscle(s) and tendon(s) of the rotator cuff of right shoulder, subsequent encounter: Secondary | ICD-10-CM | POA: Diagnosis not present

## 2017-03-28 DIAGNOSIS — M6281 Muscle weakness (generalized): Secondary | ICD-10-CM | POA: Diagnosis not present

## 2017-03-28 DIAGNOSIS — M25511 Pain in right shoulder: Secondary | ICD-10-CM | POA: Diagnosis not present

## 2017-04-01 DIAGNOSIS — M25511 Pain in right shoulder: Secondary | ICD-10-CM | POA: Diagnosis not present

## 2017-04-01 DIAGNOSIS — M6281 Muscle weakness (generalized): Secondary | ICD-10-CM | POA: Diagnosis not present

## 2017-04-01 DIAGNOSIS — S46011D Strain of muscle(s) and tendon(s) of the rotator cuff of right shoulder, subsequent encounter: Secondary | ICD-10-CM | POA: Diagnosis not present

## 2017-04-04 DIAGNOSIS — M25512 Pain in left shoulder: Secondary | ICD-10-CM | POA: Diagnosis not present

## 2017-04-05 DIAGNOSIS — S46011D Strain of muscle(s) and tendon(s) of the rotator cuff of right shoulder, subsequent encounter: Secondary | ICD-10-CM | POA: Diagnosis not present

## 2017-04-05 DIAGNOSIS — M6281 Muscle weakness (generalized): Secondary | ICD-10-CM | POA: Diagnosis not present

## 2017-04-05 DIAGNOSIS — M25511 Pain in right shoulder: Secondary | ICD-10-CM | POA: Diagnosis not present

## 2017-04-08 DIAGNOSIS — M6281 Muscle weakness (generalized): Secondary | ICD-10-CM | POA: Diagnosis not present

## 2017-04-08 DIAGNOSIS — M25511 Pain in right shoulder: Secondary | ICD-10-CM | POA: Diagnosis not present

## 2017-04-08 DIAGNOSIS — S46011D Strain of muscle(s) and tendon(s) of the rotator cuff of right shoulder, subsequent encounter: Secondary | ICD-10-CM | POA: Diagnosis not present

## 2017-04-09 DIAGNOSIS — M25511 Pain in right shoulder: Secondary | ICD-10-CM | POA: Diagnosis not present

## 2017-04-11 DIAGNOSIS — M25511 Pain in right shoulder: Secondary | ICD-10-CM | POA: Diagnosis not present

## 2017-04-11 DIAGNOSIS — M6281 Muscle weakness (generalized): Secondary | ICD-10-CM | POA: Diagnosis not present

## 2017-04-11 DIAGNOSIS — S46011D Strain of muscle(s) and tendon(s) of the rotator cuff of right shoulder, subsequent encounter: Secondary | ICD-10-CM | POA: Diagnosis not present

## 2017-04-12 DIAGNOSIS — D225 Melanocytic nevi of trunk: Secondary | ICD-10-CM | POA: Diagnosis not present

## 2017-04-12 DIAGNOSIS — Z1283 Encounter for screening for malignant neoplasm of skin: Secondary | ICD-10-CM | POA: Diagnosis not present

## 2017-04-15 DIAGNOSIS — M25511 Pain in right shoulder: Secondary | ICD-10-CM | POA: Diagnosis not present

## 2017-04-15 DIAGNOSIS — S46011D Strain of muscle(s) and tendon(s) of the rotator cuff of right shoulder, subsequent encounter: Secondary | ICD-10-CM | POA: Diagnosis not present

## 2017-04-15 DIAGNOSIS — M6281 Muscle weakness (generalized): Secondary | ICD-10-CM | POA: Diagnosis not present

## 2017-04-22 DIAGNOSIS — M6281 Muscle weakness (generalized): Secondary | ICD-10-CM | POA: Diagnosis not present

## 2017-04-22 DIAGNOSIS — M25511 Pain in right shoulder: Secondary | ICD-10-CM | POA: Diagnosis not present

## 2017-04-22 DIAGNOSIS — S46011D Strain of muscle(s) and tendon(s) of the rotator cuff of right shoulder, subsequent encounter: Secondary | ICD-10-CM | POA: Diagnosis not present

## 2017-04-25 DIAGNOSIS — M25511 Pain in right shoulder: Secondary | ICD-10-CM | POA: Diagnosis not present

## 2017-04-25 DIAGNOSIS — M6281 Muscle weakness (generalized): Secondary | ICD-10-CM | POA: Diagnosis not present

## 2017-04-25 DIAGNOSIS — S46011D Strain of muscle(s) and tendon(s) of the rotator cuff of right shoulder, subsequent encounter: Secondary | ICD-10-CM | POA: Diagnosis not present

## 2017-04-29 DIAGNOSIS — M6281 Muscle weakness (generalized): Secondary | ICD-10-CM | POA: Diagnosis not present

## 2017-04-29 DIAGNOSIS — M25511 Pain in right shoulder: Secondary | ICD-10-CM | POA: Diagnosis not present

## 2017-04-29 DIAGNOSIS — S46011D Strain of muscle(s) and tendon(s) of the rotator cuff of right shoulder, subsequent encounter: Secondary | ICD-10-CM | POA: Diagnosis not present

## 2017-05-02 DIAGNOSIS — M25511 Pain in right shoulder: Secondary | ICD-10-CM | POA: Diagnosis not present

## 2017-05-02 DIAGNOSIS — M6281 Muscle weakness (generalized): Secondary | ICD-10-CM | POA: Diagnosis not present

## 2017-05-02 DIAGNOSIS — S46011D Strain of muscle(s) and tendon(s) of the rotator cuff of right shoulder, subsequent encounter: Secondary | ICD-10-CM | POA: Diagnosis not present

## 2017-05-09 DIAGNOSIS — M6281 Muscle weakness (generalized): Secondary | ICD-10-CM | POA: Diagnosis not present

## 2017-05-09 DIAGNOSIS — S46011D Strain of muscle(s) and tendon(s) of the rotator cuff of right shoulder, subsequent encounter: Secondary | ICD-10-CM | POA: Diagnosis not present

## 2017-05-09 DIAGNOSIS — M25511 Pain in right shoulder: Secondary | ICD-10-CM | POA: Diagnosis not present

## 2017-05-10 DIAGNOSIS — S46011D Strain of muscle(s) and tendon(s) of the rotator cuff of right shoulder, subsequent encounter: Secondary | ICD-10-CM | POA: Diagnosis not present

## 2017-05-16 DIAGNOSIS — M6281 Muscle weakness (generalized): Secondary | ICD-10-CM | POA: Diagnosis not present

## 2017-05-16 DIAGNOSIS — M25511 Pain in right shoulder: Secondary | ICD-10-CM | POA: Diagnosis not present

## 2017-05-16 DIAGNOSIS — S46011D Strain of muscle(s) and tendon(s) of the rotator cuff of right shoulder, subsequent encounter: Secondary | ICD-10-CM | POA: Diagnosis not present

## 2017-06-04 NOTE — H&P (Signed)
PREOPERATIVE H&P  Chief Complaint: Other articular cartilage disorders, left shoulder, Bursitis of left shoulder, Complete rotator cuff tear or rupture of left shoulder, not specified as traumatic, Bicipital tendinitis, left shoulder  HPI: Kenneth Wagner is a 72 y.o. male who presents for preoperative history and physical with a diagnosis of Other articular cartilage disorders, left shoulder, Bursitis of left shoulder, Complete rotator cuff tear or rupture of left shoulder, not specified as traumatic, Bicipital tendinitis, left shoulder. Symptoms are rated as moderate to severe, and have been worsening.  This is significantly impairing activities of daily living.  He has elected for surgical management.   Past Medical History:  Diagnosis Date  . Allergy   . Arthritis   . Coronary artery disease   . H/O hiatal hernia   . Hypertension   . No pertinent past medical history    Past Surgical History:  Procedure Laterality Date  . CARDIAC CATHETERIZATION  5/12  . CARPAL TUNNEL RELEASE  08/13/2011   Procedure: CARPAL TUNNEL RELEASE;  Surgeon: Wynonia Sours, MD;  Location: [place] [facility];  Service: Orthopedics;  Laterality: Left;  . COLONOSCOPY    . FOREIGN BODY REMOVAL  08/13/2011   Procedure: FOREIGN BODY REMOVAL ADULT;  Surgeon: Wynonia Sours, MD;  Location: [facility];  Service: Orthopedics;  Laterality: Left;  removal foreign body index finger metacarpal phalangeal  . JOINT REPLACEMENT  2010   lt mod total knee  . JOINT REPLACEMENT  2008   rt total knee  . KNEE ARTHROPLASTY     rt and lt as young man  . KNEE ARTHROSCOPY     right and left  . ORIF FINGER FRACTURE  4/11   rt index  . RESECTION DISTAL CLAVICAL Right 02/21/2017   Procedure: DISTAL CLAVICLE EXCISION;  Surgeon: [name], MD;  Location: [place];  Service: Orthopedics;  Laterality: Right;  . SHOULDER ARTHROSCOPY WITH ROTATOR CUFF REPAIR AND SUBACROMIAL DECOMPRESSION Right  02/21/2017   Procedure: RIGHT SHOULDER ARTHROSCOPY WITH DEBRIDEMENT, ACROMIOPLASTY, ROTATOR CUFF REPAIR;  Surgeon: [name], MD;  Location: [place];  Service: Orthopedics;  Laterality: Right;  . TONSILLECTOMY    . TOTAL HIP ARTHROPLASTY Right 12/12/2016  . TOTAL HIP ARTHROPLASTY Right 12/12/2016   Procedure: TOTAL HIP ARTHROPLASTY ANTERIOR APPROACH;  Surgeon: [name], MD;  Location: [facility];  Service: Orthopedics;  Laterality: Right;   Social History   Socioeconomic History  . Marital status: Married    Spouse name: Not on file  . Number of children: Not on file  . Years of education: Not on file  . Highest education level: Not on file  Social Needs  . Financial resource strain: Not on file  . Food insecurity - worry: Not on file  . Food insecurity - inability: Not on file  . Transportation needs - medical: Not on file  . Transportation needs - non-medical: Not on file  Occupational History  . Not on file  Tobacco Use  . Smoking status: Former Smoker    Last attempt to quit: 08/07/1971    Years since quitting: 45.8  . Smokeless tobacco: Never Used  Substance and Sexual Activity  . Alcohol use: Yes    Comment: daily  . Drug use: No  . Sexual activity: Not on file  Other Topics Concern  . Not on file  Social History Narrative  . Not on file   Family History  Problem Relation Age of Onset  .  Heart disease Brother   . Hyperlipidemia Brother    Allergies  Allergen Reactions  . Bee Venom Anaphylaxis  . Lodine [Etodolac] Other (See Comments)    Gi upset   Prior to Admission medications   Medication Sig Start Date End Date Taking? Authorizing Provider  aspirin EC 81 MG tablet Take 81 mg by mouth daily.    [provider]  atorvastatin (LIPITOR) 20 MG tablet Take 20 mg by mouth every other day.    [provider]  HYDROcodone-acetaminophen (NORCO) 10-325 MG tablet Take 1 tablet by mouth every 4 (four) hours as needed for  moderate pain. 02/21/17   Aundra Dubin, PA-C  ondansetron (ZOFRAN) 4 MG tablet Take 1 tablet (4 mg total) by mouth every 8 (eight) hours as needed for nausea or vomiting. 02/21/17   Aundra Dubin, PA-C  sildenafil (REVATIO) 20 MG tablet Take 60 mg by mouth as needed (for ED).    [provider]     Positive ROS: All other systems have been reviewed and were otherwise negative with the exception of those mentioned in the HPI and as above.  Physical Exam: General: Alert, no acute distress Cardiovascular: No pedal edema Respiratory: No cyanosis, no use of accessory musculature GI: No organomegaly, abdomen is soft and non-tender Skin: No lesions in the area of chief complaint Neurologic: Sensation intact distally Psychiatric: Patient is competent for consent with normal mood and affect Lymphatic: No axillary or cervical lymphadenopathy  MUSCULOSKELETAL: L shoulder: cuff 4/5, +obriens, distal motor and sensory preserved  Assessment: Other articular cartilage disorders, left shoulder, Bursitis of left shoulder, Complete rotator cuff tear or rupture of left shoulder, not specified as traumatic, Bicipital tendinitis, left shoulder  Plan: Plan for Procedure(s): LEFT SHOULDER ARTHROSCOPY DEBRIDEMENT WITH SUBACROMIAL DECOMPRESSION, ROTATOR CUFF REPAIR AND BICEP TENODESIS  The risks benefits and alternatives were discussed with the patient including but not limited to the risks of nonoperative treatment, versus surgical intervention including infection, bleeding, nerve injury,  blood clots, cardiopulmonary complications, morbidity, mortality, among others, and they were willing to proceed.   Hiram Gash, MD  06/04/2017 7:41 AM

## 2017-07-03 ENCOUNTER — Other Ambulatory Visit: Payer: Self-pay

## 2017-07-03 ENCOUNTER — Encounter (HOSPITAL_BASED_OUTPATIENT_CLINIC_OR_DEPARTMENT_OTHER): Payer: Self-pay | Admitting: *Deleted

## 2017-07-11 ENCOUNTER — Ambulatory Visit (HOSPITAL_BASED_OUTPATIENT_CLINIC_OR_DEPARTMENT_OTHER)
Admission: RE | Admit: 2017-07-11 | Discharge: 2017-07-11 | Disposition: A | Discharge status: Home / Self Care | Payer: PPO | Source: Ambulatory Visit | Attending: Orthopaedic Surgery | Admitting: Orthopaedic Surgery

## 2017-07-11 ENCOUNTER — Encounter (HOSPITAL_BASED_OUTPATIENT_CLINIC_OR_DEPARTMENT_OTHER)
Admission: RE | Disposition: A | Discharge status: Home / Self Care | Payer: Self-pay | Source: Ambulatory Visit | Attending: Orthopaedic Surgery

## 2017-07-11 ENCOUNTER — Ambulatory Visit (HOSPITAL_BASED_OUTPATIENT_CLINIC_OR_DEPARTMENT_OTHER): Payer: PPO | Admitting: Certified Registered"

## 2017-07-11 ENCOUNTER — Encounter (HOSPITAL_BASED_OUTPATIENT_CLINIC_OR_DEPARTMENT_OTHER): Payer: Self-pay | Admitting: *Deleted

## 2017-07-11 ENCOUNTER — Other Ambulatory Visit: Payer: Self-pay

## 2017-07-11 DIAGNOSIS — M7552 Bursitis of left shoulder: Secondary | ICD-10-CM | POA: Insufficient documentation

## 2017-07-11 DIAGNOSIS — Z91030 Bee allergy status: Secondary | ICD-10-CM | POA: Diagnosis not present

## 2017-07-11 DIAGNOSIS — S43432A Superior glenoid labrum lesion of left shoulder, initial encounter: Secondary | ICD-10-CM | POA: Diagnosis not present

## 2017-07-11 DIAGNOSIS — Z79899 Other long term (current) drug therapy: Secondary | ICD-10-CM | POA: Diagnosis not present

## 2017-07-11 DIAGNOSIS — Z87892 Personal history of anaphylaxis: Secondary | ICD-10-CM | POA: Insufficient documentation

## 2017-07-11 DIAGNOSIS — Z96641 Presence of right artificial hip joint: Secondary | ICD-10-CM | POA: Insufficient documentation

## 2017-07-11 DIAGNOSIS — Z9889 Other specified postprocedural states: Secondary | ICD-10-CM | POA: Insufficient documentation

## 2017-07-11 DIAGNOSIS — Z886 Allergy status to analgesic agent status: Secondary | ICD-10-CM | POA: Diagnosis not present

## 2017-07-11 DIAGNOSIS — M7522 Bicipital tendinitis, left shoulder: Secondary | ICD-10-CM | POA: Diagnosis not present

## 2017-07-11 DIAGNOSIS — X58XXXA Exposure to other specified factors, initial encounter: Secondary | ICD-10-CM | POA: Diagnosis not present

## 2017-07-11 DIAGNOSIS — M24112 Other articular cartilage disorders, left shoulder: Secondary | ICD-10-CM | POA: Diagnosis not present

## 2017-07-11 DIAGNOSIS — K449 Diaphragmatic hernia without obstruction or gangrene: Secondary | ICD-10-CM | POA: Insufficient documentation

## 2017-07-11 DIAGNOSIS — Z87891 Personal history of nicotine dependence: Secondary | ICD-10-CM | POA: Diagnosis not present

## 2017-07-11 DIAGNOSIS — I2510 Atherosclerotic heart disease of native coronary artery without angina pectoris: Secondary | ICD-10-CM | POA: Insufficient documentation

## 2017-07-11 DIAGNOSIS — M75122 Complete rotator cuff tear or rupture of left shoulder, not specified as traumatic: Secondary | ICD-10-CM | POA: Insufficient documentation

## 2017-07-11 DIAGNOSIS — I10 Essential (primary) hypertension: Secondary | ICD-10-CM | POA: Diagnosis not present

## 2017-07-11 DIAGNOSIS — M1990 Unspecified osteoarthritis, unspecified site: Secondary | ICD-10-CM | POA: Diagnosis not present

## 2017-07-11 DIAGNOSIS — Z7982 Long term (current) use of aspirin: Secondary | ICD-10-CM | POA: Insufficient documentation

## 2017-07-11 DIAGNOSIS — M7542 Impingement syndrome of left shoulder: Secondary | ICD-10-CM | POA: Diagnosis not present

## 2017-07-11 DIAGNOSIS — Z96653 Presence of artificial knee joint, bilateral: Secondary | ICD-10-CM | POA: Insufficient documentation

## 2017-07-11 DIAGNOSIS — M75121 Complete rotator cuff tear or rupture of right shoulder, not specified as traumatic: Secondary | ICD-10-CM | POA: Diagnosis not present

## 2017-07-11 DIAGNOSIS — M75101 Unspecified rotator cuff tear or rupture of right shoulder, not specified as traumatic: Secondary | ICD-10-CM | POA: Diagnosis not present

## 2017-07-11 DIAGNOSIS — G8918 Other acute postprocedural pain: Secondary | ICD-10-CM | POA: Diagnosis not present

## 2017-07-11 HISTORY — PX: SHOULDER ARTHROSCOPY WITH SUBACROMIAL DECOMPRESSION, ROTATOR CUFF REPAIR AND BICEP TENDON REPAIR: SHX5687

## 2017-07-11 SURGERY — SHOULDER ARTHROSCOPY WITH SUBACROMIAL DECOMPRESSION, ROTATOR CUFF REPAIR AND BICEP TENDON REPAIR
Anesthesia: General | Site: Shoulder | Laterality: Left

## 2017-07-11 MED ORDER — ACETAMINOPHEN 500 MG PO TABS
ORAL_TABLET | ORAL | Status: AC
  Filled 2017-07-11: qty 2

## 2017-07-11 MED ORDER — LACTATED RINGERS IV SOLN
INTRAVENOUS | Status: DC
  Administered 2017-07-11 (×2): via INTRAVENOUS

## 2017-07-11 MED ORDER — ONDANSETRON HCL 4 MG/2ML IJ SOLN
INTRAMUSCULAR | Status: DC | PRN
  Administered 2017-07-11: 4 mg via INTRAVENOUS

## 2017-07-11 MED ORDER — GLYCOPYRROLATE 0.2 MG/ML IJ SOLN
INTRAMUSCULAR | Status: DC | PRN
  Administered 2017-07-11: 0.6 mg via INTRAVENOUS

## 2017-07-11 MED ORDER — MELOXICAM 7.5 MG PO TABS: 7.5000 mg | ORAL_TABLET | Freq: Every day | ORAL | 2 refills | Status: AC

## 2017-07-11 MED ORDER — MEPERIDINE HCL 25 MG/ML IJ SOLN: 6.2500 mg | INTRAMUSCULAR | Status: DC | PRN

## 2017-07-11 MED ORDER — EPINEPHRINE 30 MG/30ML IJ SOLN
INTRAMUSCULAR | Status: AC
  Filled 2017-07-11: qty 1

## 2017-07-11 MED ORDER — CHLORHEXIDINE GLUCONATE 4 % EX LIQD: 60.0000 mL | Freq: Once | CUTANEOUS | Status: DC

## 2017-07-11 MED ORDER — FENTANYL CITRATE (PF) 100 MCG/2ML IJ SOLN
INTRAMUSCULAR | Status: AC
  Filled 2017-07-11: qty 2

## 2017-07-11 MED ORDER — PROPOFOL 10 MG/ML IV BOLUS
INTRAVENOUS | Status: DC | PRN
  Administered 2017-07-11: 200 mg via INTRAVENOUS

## 2017-07-11 MED ORDER — MIDAZOLAM HCL 2 MG/2ML IJ SOLN
INTRAMUSCULAR | Status: AC
  Filled 2017-07-11: qty 2

## 2017-07-11 MED ORDER — NEOSTIGMINE METHYLSULFATE 10 MG/10ML IV SOLN
INTRAVENOUS | Status: DC | PRN
  Administered 2017-07-11: 4 mg via INTRAVENOUS

## 2017-07-11 MED ORDER — CEFAZOLIN SODIUM-DEXTROSE 2-4 GM/100ML-% IV SOLN
INTRAVENOUS | Status: AC
  Filled 2017-07-11: qty 100

## 2017-07-11 MED ORDER — ONDANSETRON HCL 4 MG/2ML IJ SOLN
INTRAMUSCULAR | Status: AC
  Filled 2017-07-11: qty 2

## 2017-07-11 MED ORDER — METOCLOPRAMIDE HCL 5 MG/ML IJ SOLN: 10.0000 mg | Freq: Once | INTRAMUSCULAR | Status: DC | PRN

## 2017-07-11 MED ORDER — NEOSTIGMINE METHYLSULFATE 5 MG/5ML IV SOSY
PREFILLED_SYRINGE | INTRAVENOUS | Status: AC
  Filled 2017-07-11: qty 5

## 2017-07-11 MED ORDER — MIDAZOLAM HCL 2 MG/2ML IJ SOLN
1.0000 mg | INTRAMUSCULAR | Status: DC | PRN
  Administered 2017-07-11: 2 mg via INTRAVENOUS

## 2017-07-11 MED ORDER — SCOPOLAMINE 1 MG/3DAYS TD PT72: 1.0000 | MEDICATED_PATCH | Freq: Once | TRANSDERMAL | Status: DC | PRN

## 2017-07-11 MED ORDER — ROCURONIUM BROMIDE 10 MG/ML (PF) SYRINGE
PREFILLED_SYRINGE | INTRAVENOUS | Status: AC
  Filled 2017-07-11: qty 5

## 2017-07-11 MED ORDER — EPHEDRINE SULFATE 50 MG/ML IJ SOLN
INTRAMUSCULAR | Status: DC | PRN
  Administered 2017-07-11: 10 mg via INTRAVENOUS
  Administered 2017-07-11: 5 mg via INTRAVENOUS

## 2017-07-11 MED ORDER — OMEPRAZOLE 20 MG PO CPDR: 20.0000 mg | DELAYED_RELEASE_CAPSULE | Freq: Every day | ORAL | 0 refills | Status: DC

## 2017-07-11 MED ORDER — ACETAMINOPHEN 500 MG PO TABS: 1000.0000 mg | ORAL_TABLET | Freq: Three times a day (TID) | ORAL | 0 refills | Status: AC

## 2017-07-11 MED ORDER — DEXAMETHASONE SODIUM PHOSPHATE 10 MG/ML IJ SOLN
INTRAMUSCULAR | Status: AC
  Filled 2017-07-11: qty 1

## 2017-07-11 MED ORDER — DEXAMETHASONE SODIUM PHOSPHATE 4 MG/ML IJ SOLN
INTRAMUSCULAR | Status: DC | PRN
  Administered 2017-07-11: 10 mg via INTRAVENOUS

## 2017-07-11 MED ORDER — FENTANYL CITRATE (PF) 100 MCG/2ML IJ SOLN
50.0000 ug | INTRAMUSCULAR | Status: DC | PRN
  Administered 2017-07-11 (×2): 100 ug via INTRAVENOUS

## 2017-07-11 MED ORDER — PROPOFOL 10 MG/ML IV BOLUS
INTRAVENOUS | Status: AC
  Filled 2017-07-11: qty 40

## 2017-07-11 MED ORDER — FENTANYL CITRATE (PF) 100 MCG/2ML IJ SOLN
25.0000 ug | INTRAMUSCULAR | Status: DC | PRN
  Administered 2017-07-11 (×2): 25 ug via INTRAVENOUS
  Administered 2017-07-11: 50 ug via INTRAVENOUS

## 2017-07-11 MED ORDER — ONDANSETRON HCL 4 MG PO TABS: 4.0000 mg | ORAL_TABLET | Freq: Three times a day (TID) | ORAL | 1 refills | Status: AC | PRN

## 2017-07-11 MED ORDER — CEFAZOLIN SODIUM-DEXTROSE 2-4 GM/100ML-% IV SOLN
2.0000 g | INTRAVENOUS | Status: AC
  Administered 2017-07-11: 2 g via INTRAVENOUS

## 2017-07-11 MED ORDER — OXYCODONE HCL 5 MG PO TABS: ORAL_TABLET | ORAL | 0 refills | Status: AC

## 2017-07-11 MED ORDER — ACETAMINOPHEN 500 MG PO TABS
1000.0000 mg | ORAL_TABLET | Freq: Once | ORAL | Status: AC
  Administered 2017-07-11: 1000 mg via ORAL

## 2017-07-11 MED ORDER — ROPIVACAINE HCL 5 MG/ML IJ SOLN
INTRAMUSCULAR | Status: DC | PRN
  Administered 2017-07-11: 30 mL via PERINEURAL

## 2017-07-11 MED ORDER — ROCURONIUM BROMIDE 100 MG/10ML IV SOLN
INTRAVENOUS | Status: DC | PRN
  Administered 2017-07-11: 50 mg via INTRAVENOUS

## 2017-07-11 SURGICAL SUPPLY — 69 items
ANCHOR SUT BIO SW 4.75X19.1 (Anchor) ×6 IMPLANT
ANCHOR SUT FBRTK 2 TIGTAIL (Anchor) ×4 IMPLANT
BLADE SHAVER BONE 5.0X13 (MISCELLANEOUS) ×2 IMPLANT
BLADE SURG 10 STRL SS (BLADE) IMPLANT
BNDG COHESIVE 4X5 TAN STRL (GAUZE/BANDAGES/DRESSINGS) ×2 IMPLANT
BURR OVAL 8 FLU 4.0X13 (MISCELLANEOUS) IMPLANT
CANNULA 5.75X71 LONG (CANNULA) ×2 IMPLANT
CANNULA PASSPORT BUTTON 10-40 (CANNULA) ×2 IMPLANT
CANNULA TWIST IN 8.25X7CM (CANNULA) IMPLANT
CHLORAPREP W/TINT 26ML (MISCELLANEOUS) ×2 IMPLANT
DECANTER SPIKE VIAL GLASS SM (MISCELLANEOUS) IMPLANT
DISSECTOR 3.5MM X 13CM CVD (MISCELLANEOUS) ×2 IMPLANT
DISSECTOR 4.0MMX13CM CVD (MISCELLANEOUS) IMPLANT
DRAPE IMP U-DRAPE 54X76 (DRAPES) ×2 IMPLANT
DRAPE INCISE IOBAN 66X45 STRL (DRAPES) IMPLANT
DRAPE STERI 35X30 U-POUCH (DRAPES) ×2 IMPLANT
DRAPE U-SHAPE 76X120 STRL (DRAPES) ×2 IMPLANT
DRSG PAD ABDOMINAL 8X10 ST (GAUZE/BANDAGES/DRESSINGS) ×2 IMPLANT
ELECT NEEDLE TIP 2.8 STRL (NEEDLE) IMPLANT
ELECT REM PT RETURN 9FT ADLT (ELECTROSURGICAL) ×2
ELECTRODE REM PT RTRN 9FT ADLT (ELECTROSURGICAL) ×1 IMPLANT
GAUZE SPONGE 4X4 12PLY STRL (GAUZE/BANDAGES/DRESSINGS) ×2 IMPLANT
GAUZE XEROFORM 1X8 LF (GAUZE/BANDAGES/DRESSINGS) IMPLANT
GLOVE BIO SURGEON STRL SZ 6.5 (GLOVE) ×2 IMPLANT
GLOVE BIOGEL PI IND STRL 7.0 (GLOVE) ×2 IMPLANT
GLOVE BIOGEL PI IND STRL 8 (GLOVE) ×1 IMPLANT
GLOVE BIOGEL PI INDICATOR 7.0 (GLOVE) ×2
GLOVE BIOGEL PI INDICATOR 8 (GLOVE) ×1
GLOVE ECLIPSE 8.0 STRL XLNG CF (GLOVE) ×4 IMPLANT
GOWN STRL REUS W/ TWL LRG LVL3 (GOWN DISPOSABLE) ×2 IMPLANT
GOWN STRL REUS W/TWL LRG LVL3 (GOWN DISPOSABLE) ×2
GOWN STRL REUS W/TWL XL LVL3 (GOWN DISPOSABLE) ×2 IMPLANT
KIT PUSHLOCK 2.9 HIP (KITS) IMPLANT
KIT SPEAR STR 1.6MM DRILL (MISCELLANEOUS) ×2 IMPLANT
KIT STABILIZATION SHOULDER (MISCELLANEOUS) ×2 IMPLANT
LASSO 90 CVE QUICKPAS (DISPOSABLE) IMPLANT
MANIFOLD NEPTUNE II (INSTRUMENTS) ×2 IMPLANT
NDL SUT 6 .5 CRC .975X.05 MAYO (NEEDLE) IMPLANT
NEEDLE MAYO TAPER (NEEDLE)
NEEDLE SCORPION MULTI FIRE (NEEDLE) ×2 IMPLANT
PACK ARTHROSCOPY DSU (CUSTOM PROCEDURE TRAY) ×2 IMPLANT
PACK BASIN DAY SURGERY FS (CUSTOM PROCEDURE TRAY) ×2 IMPLANT
PENCIL BUTTON HOLSTER BLD 10FT (ELECTRODE) IMPLANT
PROBE BIPOLAR ATHRO 135MM 90D (MISCELLANEOUS) ×2 IMPLANT
RESTRAINT HEAD UNIVERSAL NS (MISCELLANEOUS) ×2 IMPLANT
SHEET MEDIUM DRAPE 40X70 STRL (DRAPES) ×2 IMPLANT
SLEEVE SCD COMPRESS KNEE MED (MISCELLANEOUS) ×2 IMPLANT
SLING ARM FOAM STRAP LRG (SOFTGOODS) IMPLANT
SLING ARM IMMOBILIZER LRG (SOFTGOODS) IMPLANT
SLING ARM IMMOBILIZER MED (SOFTGOODS) IMPLANT
SLING ARM MED ADULT FOAM STRAP (SOFTGOODS) IMPLANT
SLING ARM XL FOAM STRAP (SOFTGOODS) IMPLANT
SPONGE LAP 4X18 X RAY DECT (DISPOSABLE) IMPLANT
STRIP CLOSURE SKIN 1/2X4 (GAUZE/BANDAGES/DRESSINGS) IMPLANT
SUCTION FRAZIER HANDLE 10FR (MISCELLANEOUS)
SUCTION TUBE FRAZIER 10FR DISP (MISCELLANEOUS) IMPLANT
SUT ETHILON 3 0 PS 1 (SUTURE) IMPLANT
SUT FIBERWIRE #2 38 T-5 BLUE (SUTURE)
SUT MON AB 2-0 CT1 36 (SUTURE) IMPLANT
SUT TIGER TAPE 7 IN WHITE (SUTURE) IMPLANT
SUTURE FIBERWR #2 38 T-5 BLUE (SUTURE) IMPLANT
SUTURE TAPE TIGERLINK 1.3MM BL (SUTURE) IMPLANT
SUTURETAPE TIGERLINK 1.3MM BL (SUTURE)
TAPE FIBER 2MM 7IN #2 BLUE (SUTURE) ×2 IMPLANT
TOWEL OR 17X24 6PK STRL BLUE (TOWEL DISPOSABLE) ×2 IMPLANT
TOWEL OR NON WOVEN STRL DISP B (DISPOSABLE) ×2 IMPLANT
TUBE CONNECTING 20X1/4 (TUBING) ×2 IMPLANT
TUBING ARTHROSCOPY IRRIG 16FT (MISCELLANEOUS) ×2 IMPLANT
YANKAUER SUCT BULB TIP NO VENT (SUCTIONS) IMPLANT

## 2017-07-11 NOTE — Anesthesia Procedure Notes (Signed)
Anesthesia Regional Block: Supraclavicular block   Pre-Anesthetic Checklist: ,, timeout performed, Correct Patient, Correct Site, Correct Laterality, Correct Procedure, Correct Position, site marked, Risks and benefits discussed,  Surgical consent,  Pre-op evaluation,  At surgeon's request and post-op pain management  Laterality: Left and Upper  Prep: Maximum Sterile Barrier Precautions used, chloraprep       Needles:  Injection technique: Single-shot  Needle Type: Echogenic Stimulator Needle     Needle Length: 10cm      Additional Needles:   Procedures:,,,, ultrasound used (permanent image in chart),,,,  Narrative:  Start time: 07/11/2017 7:00 AM End time: 07/11/2017 7:10 AM Injection made incrementally with aspirations every 5 mL.  Performed by: Personally  Anesthesiologist: [name], MD  Additional Notes: Risks, benefits and alternative to block explained extensively.  Patient tolerated procedure well, without complications.

## 2017-07-11 NOTE — Anesthesia Postprocedure Evaluation (Signed)
Anesthesia Post Note  Patient: Kenneth Wagner  Procedure(s) Performed: LEFT SHOULDER ARTHROSCOPY DEBRIDEMENT WITH SUBACROMIAL DECOMPRESSION, ROTATOR CUFF REPAIR AND BICEP TENODESIS (Left Shoulder)     Patient location during evaluation: PACU Anesthesia Type: General Level of consciousness: awake and alert Pain management: pain level controlled Vital Signs Assessment: post-procedure vital signs reviewed and stable Respiratory status: spontaneous breathing, nonlabored ventilation, respiratory function stable and patient connected to nasal cannula oxygen Cardiovascular status: blood pressure returned to baseline and stable Postop Assessment: no apparent nausea or vomiting Anesthetic complications: no    Last Vitals:  Vitals:   07/11/17 1030 07/11/17 1045  BP: (!) 143/90 139/86  Pulse: 68 65  Resp: 13 12  Temp:    SpO2: 94% 96%    Last Pain:  Vitals:   07/11/17 1115  TempSrc:   PainSc: 3                  [name]

## 2017-07-11 NOTE — Transfer of Care (Signed)
Immediate Anesthesia Transfer of Care Note  Patient: Kenneth Wagner  Procedure(s) Performed: LEFT SHOULDER ARTHROSCOPY DEBRIDEMENT WITH SUBACROMIAL DECOMPRESSION, ROTATOR CUFF REPAIR AND BICEP TENODESIS (Left Shoulder)  Patient Location: PACU  Anesthesia Type:General  Level of Consciousness: awake, alert  and oriented  Airway & Oxygen Therapy: Patient Spontanous Breathing and Patient connected to face mask oxygen  Post-op Assessment: Report given to RN and Post -op Vital signs reviewed and stable  Post vital signs: Reviewed and stable  Last Vitals:  Vitals:   07/11/17 0700 07/11/17 0705  BP: (!) 156/95 (!) 148/82  Pulse: 69 63  Resp: 17 16  Temp:    SpO2: 98% 100%    Last Pain:  Vitals:   07/11/17 0628  TempSrc: Oral         Complications: No apparent anesthesia complications

## 2017-07-11 NOTE — Anesthesia Preprocedure Evaluation (Signed)
Anesthesia Evaluation  Patient identified by MRN, date of birth, ID band Patient awake    Reviewed: Allergy & Precautions, H&P , NPO status , Patient's Chart, lab work & pertinent test results  Airway Mallampati: I  TM Distance: >3 FB Neck ROM: Full    Dental no notable dental hx.    Pulmonary neg pulmonary ROS, former smoker,    Pulmonary exam normal breath sounds clear to auscultation       Cardiovascular hypertension, + CAD  Normal cardiovascular exam Rhythm:Regular Rate:Normal     Neuro/Psych negative neurological ROS  negative psych ROS   GI/Hepatic Neg liver ROS, hiatal hernia,   Endo/Other  negative endocrine ROS  Renal/GU negative Renal ROS  negative genitourinary   Musculoskeletal negative musculoskeletal ROS (+) Arthritis ,   Abdominal   Peds negative pediatric ROS (+)  Hematology negative hematology ROS (+)   Anesthesia Other Findings   Reproductive/Obstetrics negative OB ROS                             Lab Results  Component Value Date   WBC 11.8 (H) 12/13/2016   HGB 10.6 (L) 12/13/2016   HCT 31.1 (L) 12/13/2016   MCV 94.0 12/13/2016   PLT 190 12/13/2016   Lab Results  Component Value Date   CREATININE 0.94 12/13/2016   BUN 9 12/13/2016   NA 136 12/13/2016   K 4.3 12/13/2016   CL 103 12/13/2016   CO2 27 12/13/2016    Anesthesia Physical  Anesthesia Plan  ASA: III  Anesthesia Plan: General   Post-op Pain Management:  Regional for Post-op pain   Induction: Intravenous  PONV Risk Score and Plan: 2 and Ondansetron and Dexamethasone  Airway Management Planned: Oral ETT  Additional Equipment:   Intra-op Plan:   Post-operative Plan: Extubation in OR  Informed Consent: I have reviewed the patients History and Physical, chart, labs and discussed the procedure including the risks, benefits and alternatives for the proposed anesthesia with the patient or  authorized representative who has indicated his/her understanding and acceptance.   Dental advisory given  Plan Discussed with: CRNA  Anesthesia Plan Comments: (  )        Anesthesia Quick Evaluation

## 2017-07-11 NOTE — Interval H&P Note (Signed)
Discussed case, risks and benefits with patient again.  All questions answered, no change to history.  Amyri Frenz MD  

## 2017-07-11 NOTE — Progress Notes (Signed)
Assisted Dr. Carignan with left, ultrasound guided, interscalene  block. Side rails up, monitors on throughout procedure. See vital signs in flow sheet. Tolerated Procedure well. 

## 2017-07-11 NOTE — Discharge Instructions (Signed)
Regional Anesthesia Blocks ? ?1. Numbness or the inability to move the "blocked" extremity may last from 3-48 hours after placement. The length of time depends on the medication injected and your individual response to the medication. If the numbness is not going away after 48 hours, call your surgeon. ? ?2. The extremity that is blocked will need to be protected until the numbness is gone and the  Strength has returned. Because you cannot feel it, you will need to take extra care to avoid injury. Because it may be weak, you may have difficulty moving it or using it. You may not know what position it is in without looking at it while the block is in effect. ? ?3. For blocks in the legs and feet, returning to weight bearing and walking needs to be done carefully. You will need to wait until the numbness is entirely gone and the strength has returned. You should be able to move your leg and foot normally before you try and bear weight or walk. You will need someone to be with you when you first try to ensure you do not fall and possibly risk injury. ? ?4. Bruising and tenderness at the needle site are common side effects and will resolve in a few days. ? ?5. Persistent numbness or new problems with movement should be communicated to the surgeon or the [facility] ([phone])/ [facility] ([phone]).  ? ?Post Anesthesia Home Care Instructions ? ?Activity: ?Get plenty of rest for the remainder of the day. A responsible individual must stay with you for 24 hours following the procedure.  ?For the next 24 hours, DO NOT: ?-Drive a car ?-Operate machinery ?-Drink alcoholic beverages ?-Take any medication unless instructed by your physician ?-Make any legal decisions or sign important papers. ? ?Meals: ?Start with liquid foods such as gelatin or soup. Progress to regular foods as tolerated. Avoid greasy, spicy, heavy foods. If nausea and/or vomiting occur, drink only clear liquids until the  nausea and/or vomiting subsides. Call your physician if vomiting continues. ? ?Special Instructions/Symptoms: ?Your throat may feel dry or sore from the anesthesia or the breathing tube placed in your throat during surgery. If this causes discomfort, gargle with warm salt water. The discomfort should disappear within 24 hours. ? ?If you had a scopolamine patch placed behind your ear for the management of post- operative nausea and/or vomiting: ? ?1. The medication in the patch is effective for 72 hours, after which it should be removed.  Wrap patch in a tissue and discard in the trash. Wash hands thoroughly with soap and water. ?2. You may remove the patch earlier than 72 hours if you experience unpleasant side effects which may include dry mouth, dizziness or visual disturbances. ?3. Avoid touching the patch. Wash your hands with soap and water after contact with the patch. ?    ?

## 2017-07-11 NOTE — Op Note (Signed)
Orthopaedic Surgery Operative Note (CSN: [id number])  Kenneth Wagner  [birth year] Date of Surgery: 07/11/2017   Diagnoses:  Left shoulder rotator cuff tear, biceps fraying, subacromial impingement  Procedures: Left shoulder rotator cuff repair Arthroscopic biceps tenodesis Subacromial decompression Extensive debridement, arthroscopic    Operative Finding Successful completion of planned procedure.  Camera system malfunctioned thus not able to save images through case normally.   Exam under anesthesia: Motion full Articular space:  Degenerative labral tearing from 3 to 9 oclock,  Chondral surfaces:Intact, no sign of chondral degeneration on the glenoid or humeral head Biceps: Significant tearing of the biceps with a type 2 SLAP as well.  Subscapularis: intact Superior Cuff: frayed supraspinatus articular side, full thickness small area of tear and significant degeneration with wear pattern noted on bursal side.  Infra intact.  Post-operative plan: The patient will be NWB in sling for 6 weeks.  The patient will be dc home.  DVT prophylaxis not indicated in isolated upper extremity surgery patient with no specific risks factors.  Pain control with PRN pain medication preferring oral medicines.  Follow up plan will be scheduled in approximately 7 days for incision check and XR outlet.  Post-Op Diagnosis: Same Surgeons:Primary: Hiram Gash, MD Assistants:none Location: [facility] OR ROOM 6 Anesthesia: General Antibiotics: Ancef 2g preop Tourniquet time: * No tourniquets in log * Estimated Blood Loss: 87mL Complications: None Specimens: None Implants: Implant Name Type Inv. Item Serial No. Manufacturer Lot No. LRB No. Used Action  [employer] [id number] Left 1 Implanted  [facility] SUTURE ANCHOR Anchor    [id number] Left 1 Implanted  ANCHOR SUT BIO SW 4.75X19.1 - [id number] Anchor ANCHOR SUT BIO SW 4.75X19.1  [employer] [id number] Left 1 Implanted  ANCHOR SUT  BIO SW 4.75X19.1 - [id number] Anchor ANCHOR SUT BIO SW 4.75X19.1  [employer] [id number] Left 1 Implanted  ANCHOR SUT BIO SW 4.75X19.1 - [id number] Anchor ANCHOR SUT BIO SW 4.75X19.1  Rolinda Roan [id number] Left 1 Implanted    Indications for Surgery:   Kenneth Wagner is a 72 y.o. male with previous Right cuff repair and near full thickness supraspinatus tear noted on MRI, failed non-operative management with Dr. Amada Jupiter and was set up to see me for operative evaluation after.  Benefits and risks of operative and nonoperative management were discussed prior to surgery with patient/guardian(s) and informed consent form was completed.  Specific risks including infection, need for additional surgery, biceps tendon rupture, continued pain, retear.   Procedure:    Patient was correctly identified in the preoperative holding area and operative site marked.  Patient brought to OR and positioned beachchair on an Carpio table ensuring that all bony prominences were padded and the head was in an appropriate location.  Anesthesia was induced and the operative shoulder was prepped and draped in the usual sterile fashion.  Timeout was called preincision.  A standard posterior viewing portal was made after localizing the portal with a spinal needle.  An anterior accessory portal was also made.  After clearing the articular space the camera was positioned in the subacromial space.  Findings above.  We performed an extensive debridement of the labral tissue, biceps anchor and released the biceps with an arthroscopic biting device and shaver.    Rotator Cuff Repair:: Arthroscopic Rotator Cuff Repair: Following completion of the above we placed 1 4.17mm Swivelock loaded with a tape suture anchor at inserted at the medial articular margin and an scorpion suture passing device,  as well as a Penetrator subsequently used to shuttle sutures medially in a horizontal mattress suture configuration with arthroscopic knot tying  techniques then utilized to tie the suture limbs to partner reducing the tendon at the prepared insertion site, and with a medial row suture limbs then incorporated, 2 anteriorly and 2 posteriorly, into each of two 4.75 PEEK SwiveLock anchors, each placed 8 to 10 mm below the tip of the tuberosity and spanning anterior-posterior width of the tear with care to avoid over tensioning.   Subacromial decompression: We made a lateral portal with spinal needle guidance. We then proceeded to debride bursal tissue extensively with a shaver and arthrocare device. At that point we continued to identify the borders of the acromion and identify the spur. We then carefully preserved the deltoid fascia and used a burr to convert the type 2 acromion to a Type 1 flat acromion without issue.  Biceps tenodesis: We marked the tendon and then performed a tenotomy and debridement of the stump in the articular space. We then identified the biceps tendon in its groove suprapec with the arthroscope in the lateral portal taking care to move from lateral to medial to avoid injury to the subscapularis. At that point we unroofed the tendon itself and mobilized it. An accessory anterior portal was made in line with the tendon and we grasped it from the anterior superior portal and worked from the accessory anterior portal. Two Fibertak 1.68mm anchors were placed in the groove and the tendon was secured in a luggage loop style fashion with good tension on the tendon.  The staff informed me at the end of the case that arthroscopic images were not saving properly to the system and this was after the case was completed.      The incisions were closed with absorbable monocryl, benzoin and steri strips.  A sterile dressing was placed along with a sling. The patient was awoken from general anesthesia and taken to the PACU in stable condition without complication.

## 2017-07-11 NOTE — Anesthesia Procedure Notes (Signed)
Procedure Name: Intubation Performed by: Verita Lamb, CRNA Pre-anesthesia Checklist: Patient identified, Emergency Drugs available, Suction available, Patient being monitored and Timeout performed Oxygen Delivery Method: Circle system utilized Preoxygenation: Pre-oxygenation with 100% oxygen Induction Type: IV induction Ventilation: Mask ventilation without difficulty Laryngoscope Size: Mac and 3 Grade View: Grade I Tube type: Oral Tube size: 7.0 mm Number of attempts: 1 Airway Equipment and Method: Stylet Placement Confirmation: ETT inserted through vocal cords under direct vision,  positive ETCO2,  breath sounds checked- equal and bilateral and CO2 detector Secured at: 22 cm Tube secured with: Tape Dental Injury: Teeth and Oropharynx as per pre-operative assessment

## 2017-07-12 ENCOUNTER — Encounter (HOSPITAL_BASED_OUTPATIENT_CLINIC_OR_DEPARTMENT_OTHER): Payer: Self-pay | Admitting: Orthopaedic Surgery

## 2017-07-19 DIAGNOSIS — M24112 Other articular cartilage disorders, left shoulder: Secondary | ICD-10-CM | POA: Diagnosis not present

## 2017-07-24 DIAGNOSIS — M25512 Pain in left shoulder: Secondary | ICD-10-CM | POA: Diagnosis not present

## 2017-07-24 DIAGNOSIS — M25612 Stiffness of left shoulder, not elsewhere classified: Secondary | ICD-10-CM | POA: Diagnosis not present

## 2017-07-24 DIAGNOSIS — S46012D Strain of muscle(s) and tendon(s) of the rotator cuff of left shoulder, subsequent encounter: Secondary | ICD-10-CM | POA: Diagnosis not present

## 2017-07-29 DIAGNOSIS — M25512 Pain in left shoulder: Secondary | ICD-10-CM | POA: Diagnosis not present

## 2017-07-29 DIAGNOSIS — S46012D Strain of muscle(s) and tendon(s) of the rotator cuff of left shoulder, subsequent encounter: Secondary | ICD-10-CM | POA: Diagnosis not present

## 2017-07-29 DIAGNOSIS — M25612 Stiffness of left shoulder, not elsewhere classified: Secondary | ICD-10-CM | POA: Diagnosis not present

## 2017-08-01 DIAGNOSIS — M25512 Pain in left shoulder: Secondary | ICD-10-CM | POA: Diagnosis not present

## 2017-08-01 DIAGNOSIS — M25612 Stiffness of left shoulder, not elsewhere classified: Secondary | ICD-10-CM | POA: Diagnosis not present

## 2017-08-01 DIAGNOSIS — S46012D Strain of muscle(s) and tendon(s) of the rotator cuff of left shoulder, subsequent encounter: Secondary | ICD-10-CM | POA: Diagnosis not present

## 2017-08-05 DIAGNOSIS — S46012D Strain of muscle(s) and tendon(s) of the rotator cuff of left shoulder, subsequent encounter: Secondary | ICD-10-CM | POA: Diagnosis not present

## 2017-08-05 DIAGNOSIS — M25512 Pain in left shoulder: Secondary | ICD-10-CM | POA: Diagnosis not present

## 2017-08-05 DIAGNOSIS — M25612 Stiffness of left shoulder, not elsewhere classified: Secondary | ICD-10-CM | POA: Diagnosis not present

## 2017-08-08 DIAGNOSIS — S46012D Strain of muscle(s) and tendon(s) of the rotator cuff of left shoulder, subsequent encounter: Secondary | ICD-10-CM | POA: Diagnosis not present

## 2017-08-08 DIAGNOSIS — M25612 Stiffness of left shoulder, not elsewhere classified: Secondary | ICD-10-CM | POA: Diagnosis not present

## 2017-08-08 DIAGNOSIS — M25512 Pain in left shoulder: Secondary | ICD-10-CM | POA: Diagnosis not present

## 2017-08-12 DIAGNOSIS — S46012D Strain of muscle(s) and tendon(s) of the rotator cuff of left shoulder, subsequent encounter: Secondary | ICD-10-CM | POA: Diagnosis not present

## 2017-08-12 DIAGNOSIS — M25512 Pain in left shoulder: Secondary | ICD-10-CM | POA: Diagnosis not present

## 2017-08-12 DIAGNOSIS — M25612 Stiffness of left shoulder, not elsewhere classified: Secondary | ICD-10-CM | POA: Diagnosis not present

## 2017-08-15 DIAGNOSIS — S46012D Strain of muscle(s) and tendon(s) of the rotator cuff of left shoulder, subsequent encounter: Secondary | ICD-10-CM | POA: Diagnosis not present

## 2017-08-15 DIAGNOSIS — M25612 Stiffness of left shoulder, not elsewhere classified: Secondary | ICD-10-CM | POA: Diagnosis not present

## 2017-08-15 DIAGNOSIS — M25512 Pain in left shoulder: Secondary | ICD-10-CM | POA: Diagnosis not present

## 2017-08-21 DIAGNOSIS — M25512 Pain in left shoulder: Secondary | ICD-10-CM | POA: Diagnosis not present

## 2017-08-21 DIAGNOSIS — M25612 Stiffness of left shoulder, not elsewhere classified: Secondary | ICD-10-CM | POA: Diagnosis not present

## 2017-08-21 DIAGNOSIS — S46012D Strain of muscle(s) and tendon(s) of the rotator cuff of left shoulder, subsequent encounter: Secondary | ICD-10-CM | POA: Diagnosis not present

## 2017-08-23 DIAGNOSIS — M25612 Stiffness of left shoulder, not elsewhere classified: Secondary | ICD-10-CM | POA: Diagnosis not present

## 2017-08-23 DIAGNOSIS — M25512 Pain in left shoulder: Secondary | ICD-10-CM | POA: Diagnosis not present

## 2017-08-23 DIAGNOSIS — S46012D Strain of muscle(s) and tendon(s) of the rotator cuff of left shoulder, subsequent encounter: Secondary | ICD-10-CM | POA: Diagnosis not present

## 2017-08-26 DIAGNOSIS — M25612 Stiffness of left shoulder, not elsewhere classified: Secondary | ICD-10-CM | POA: Diagnosis not present

## 2017-08-26 DIAGNOSIS — M25512 Pain in left shoulder: Secondary | ICD-10-CM | POA: Diagnosis not present

## 2017-08-26 DIAGNOSIS — S46012D Strain of muscle(s) and tendon(s) of the rotator cuff of left shoulder, subsequent encounter: Secondary | ICD-10-CM | POA: Diagnosis not present

## 2017-08-29 DIAGNOSIS — M25512 Pain in left shoulder: Secondary | ICD-10-CM | POA: Diagnosis not present

## 2017-08-29 DIAGNOSIS — M25612 Stiffness of left shoulder, not elsewhere classified: Secondary | ICD-10-CM | POA: Diagnosis not present

## 2017-08-29 DIAGNOSIS — S46012D Strain of muscle(s) and tendon(s) of the rotator cuff of left shoulder, subsequent encounter: Secondary | ICD-10-CM | POA: Diagnosis not present

## 2017-08-30 DIAGNOSIS — M25512 Pain in left shoulder: Secondary | ICD-10-CM | POA: Diagnosis not present

## 2017-09-02 DIAGNOSIS — M25512 Pain in left shoulder: Secondary | ICD-10-CM | POA: Diagnosis not present

## 2017-09-02 DIAGNOSIS — M25612 Stiffness of left shoulder, not elsewhere classified: Secondary | ICD-10-CM | POA: Diagnosis not present

## 2017-09-02 DIAGNOSIS — S46012D Strain of muscle(s) and tendon(s) of the rotator cuff of left shoulder, subsequent encounter: Secondary | ICD-10-CM | POA: Diagnosis not present

## 2017-09-05 DIAGNOSIS — S46012D Strain of muscle(s) and tendon(s) of the rotator cuff of left shoulder, subsequent encounter: Secondary | ICD-10-CM | POA: Diagnosis not present

## 2017-09-05 DIAGNOSIS — M25612 Stiffness of left shoulder, not elsewhere classified: Secondary | ICD-10-CM | POA: Diagnosis not present

## 2017-09-05 DIAGNOSIS — M25512 Pain in left shoulder: Secondary | ICD-10-CM | POA: Diagnosis not present

## 2017-09-09 DIAGNOSIS — M25512 Pain in left shoulder: Secondary | ICD-10-CM | POA: Diagnosis not present

## 2017-09-09 DIAGNOSIS — S46012D Strain of muscle(s) and tendon(s) of the rotator cuff of left shoulder, subsequent encounter: Secondary | ICD-10-CM | POA: Diagnosis not present

## 2017-09-09 DIAGNOSIS — M25612 Stiffness of left shoulder, not elsewhere classified: Secondary | ICD-10-CM | POA: Diagnosis not present

## 2017-09-12 DIAGNOSIS — S46012D Strain of muscle(s) and tendon(s) of the rotator cuff of left shoulder, subsequent encounter: Secondary | ICD-10-CM | POA: Diagnosis not present

## 2017-09-12 DIAGNOSIS — M25612 Stiffness of left shoulder, not elsewhere classified: Secondary | ICD-10-CM | POA: Diagnosis not present

## 2017-09-12 DIAGNOSIS — M25512 Pain in left shoulder: Secondary | ICD-10-CM | POA: Diagnosis not present

## 2017-09-18 DIAGNOSIS — M25512 Pain in left shoulder: Secondary | ICD-10-CM | POA: Diagnosis not present

## 2017-09-18 DIAGNOSIS — M25612 Stiffness of left shoulder, not elsewhere classified: Secondary | ICD-10-CM | POA: Diagnosis not present

## 2017-09-18 DIAGNOSIS — S46012D Strain of muscle(s) and tendon(s) of the rotator cuff of left shoulder, subsequent encounter: Secondary | ICD-10-CM | POA: Diagnosis not present

## 2017-09-23 DIAGNOSIS — S46012D Strain of muscle(s) and tendon(s) of the rotator cuff of left shoulder, subsequent encounter: Secondary | ICD-10-CM | POA: Diagnosis not present

## 2017-09-23 DIAGNOSIS — E7800 Pure hypercholesterolemia, unspecified: Secondary | ICD-10-CM | POA: Diagnosis not present

## 2017-09-23 DIAGNOSIS — M25612 Stiffness of left shoulder, not elsewhere classified: Secondary | ICD-10-CM | POA: Diagnosis not present

## 2017-09-23 DIAGNOSIS — I10 Essential (primary) hypertension: Secondary | ICD-10-CM | POA: Diagnosis not present

## 2017-09-23 DIAGNOSIS — M25512 Pain in left shoulder: Secondary | ICD-10-CM | POA: Diagnosis not present

## 2017-09-26 DIAGNOSIS — S46012D Strain of muscle(s) and tendon(s) of the rotator cuff of left shoulder, subsequent encounter: Secondary | ICD-10-CM | POA: Diagnosis not present

## 2017-09-26 DIAGNOSIS — M25512 Pain in left shoulder: Secondary | ICD-10-CM | POA: Diagnosis not present

## 2017-09-26 DIAGNOSIS — M25612 Stiffness of left shoulder, not elsewhere classified: Secondary | ICD-10-CM | POA: Diagnosis not present

## 2017-09-30 DIAGNOSIS — M25512 Pain in left shoulder: Secondary | ICD-10-CM | POA: Diagnosis not present

## 2017-09-30 DIAGNOSIS — S46012D Strain of muscle(s) and tendon(s) of the rotator cuff of left shoulder, subsequent encounter: Secondary | ICD-10-CM | POA: Diagnosis not present

## 2017-09-30 DIAGNOSIS — M25612 Stiffness of left shoulder, not elsewhere classified: Secondary | ICD-10-CM | POA: Diagnosis not present

## 2017-10-03 DIAGNOSIS — M25512 Pain in left shoulder: Secondary | ICD-10-CM | POA: Diagnosis not present

## 2017-10-03 DIAGNOSIS — S46012D Strain of muscle(s) and tendon(s) of the rotator cuff of left shoulder, subsequent encounter: Secondary | ICD-10-CM | POA: Diagnosis not present

## 2017-10-03 DIAGNOSIS — M25612 Stiffness of left shoulder, not elsewhere classified: Secondary | ICD-10-CM | POA: Diagnosis not present

## 2017-10-04 DIAGNOSIS — M25512 Pain in left shoulder: Secondary | ICD-10-CM | POA: Diagnosis not present

## 2017-10-08 DIAGNOSIS — M25512 Pain in left shoulder: Secondary | ICD-10-CM | POA: Diagnosis not present

## 2017-10-08 DIAGNOSIS — S46012D Strain of muscle(s) and tendon(s) of the rotator cuff of left shoulder, subsequent encounter: Secondary | ICD-10-CM | POA: Diagnosis not present

## 2017-10-08 DIAGNOSIS — M25612 Stiffness of left shoulder, not elsewhere classified: Secondary | ICD-10-CM | POA: Diagnosis not present

## 2017-10-11 DIAGNOSIS — M25612 Stiffness of left shoulder, not elsewhere classified: Secondary | ICD-10-CM | POA: Diagnosis not present

## 2017-10-11 DIAGNOSIS — S46012D Strain of muscle(s) and tendon(s) of the rotator cuff of left shoulder, subsequent encounter: Secondary | ICD-10-CM | POA: Diagnosis not present

## 2017-10-11 DIAGNOSIS — M25512 Pain in left shoulder: Secondary | ICD-10-CM | POA: Diagnosis not present

## 2017-10-14 DIAGNOSIS — M25512 Pain in left shoulder: Secondary | ICD-10-CM | POA: Diagnosis not present

## 2017-10-14 DIAGNOSIS — S46012D Strain of muscle(s) and tendon(s) of the rotator cuff of left shoulder, subsequent encounter: Secondary | ICD-10-CM | POA: Diagnosis not present

## 2017-10-14 DIAGNOSIS — M25612 Stiffness of left shoulder, not elsewhere classified: Secondary | ICD-10-CM | POA: Diagnosis not present

## 2017-10-17 DIAGNOSIS — M25612 Stiffness of left shoulder, not elsewhere classified: Secondary | ICD-10-CM | POA: Diagnosis not present

## 2017-10-17 DIAGNOSIS — S46012D Strain of muscle(s) and tendon(s) of the rotator cuff of left shoulder, subsequent encounter: Secondary | ICD-10-CM | POA: Diagnosis not present

## 2017-10-17 DIAGNOSIS — M25512 Pain in left shoulder: Secondary | ICD-10-CM | POA: Diagnosis not present

## 2017-10-22 DIAGNOSIS — S46012D Strain of muscle(s) and tendon(s) of the rotator cuff of left shoulder, subsequent encounter: Secondary | ICD-10-CM | POA: Diagnosis not present

## 2017-10-22 DIAGNOSIS — M25612 Stiffness of left shoulder, not elsewhere classified: Secondary | ICD-10-CM | POA: Diagnosis not present

## 2017-10-22 DIAGNOSIS — M25512 Pain in left shoulder: Secondary | ICD-10-CM | POA: Diagnosis not present

## 2017-10-24 DIAGNOSIS — M25512 Pain in left shoulder: Secondary | ICD-10-CM | POA: Diagnosis not present

## 2017-10-24 DIAGNOSIS — S46012D Strain of muscle(s) and tendon(s) of the rotator cuff of left shoulder, subsequent encounter: Secondary | ICD-10-CM | POA: Diagnosis not present

## 2017-10-24 DIAGNOSIS — M25612 Stiffness of left shoulder, not elsewhere classified: Secondary | ICD-10-CM | POA: Diagnosis not present

## 2017-10-25 DIAGNOSIS — X32XXXA Exposure to sunlight, initial encounter: Secondary | ICD-10-CM | POA: Diagnosis not present

## 2017-10-25 DIAGNOSIS — D485 Neoplasm of uncertain behavior of skin: Secondary | ICD-10-CM | POA: Diagnosis not present

## 2017-10-25 DIAGNOSIS — L820 Inflamed seborrheic keratosis: Secondary | ICD-10-CM | POA: Diagnosis not present

## 2017-10-25 DIAGNOSIS — D225 Melanocytic nevi of trunk: Secondary | ICD-10-CM | POA: Diagnosis not present

## 2017-10-25 DIAGNOSIS — L570 Actinic keratosis: Secondary | ICD-10-CM | POA: Diagnosis not present

## 2017-10-28 DIAGNOSIS — S46012D Strain of muscle(s) and tendon(s) of the rotator cuff of left shoulder, subsequent encounter: Secondary | ICD-10-CM | POA: Diagnosis not present

## 2017-10-28 DIAGNOSIS — M25612 Stiffness of left shoulder, not elsewhere classified: Secondary | ICD-10-CM | POA: Diagnosis not present

## 2017-10-28 DIAGNOSIS — M25512 Pain in left shoulder: Secondary | ICD-10-CM | POA: Diagnosis not present

## 2017-10-31 DIAGNOSIS — M25512 Pain in left shoulder: Secondary | ICD-10-CM | POA: Diagnosis not present

## 2017-10-31 DIAGNOSIS — M25612 Stiffness of left shoulder, not elsewhere classified: Secondary | ICD-10-CM | POA: Diagnosis not present

## 2017-10-31 DIAGNOSIS — S46012D Strain of muscle(s) and tendon(s) of the rotator cuff of left shoulder, subsequent encounter: Secondary | ICD-10-CM | POA: Diagnosis not present

## 2017-11-04 DIAGNOSIS — M25512 Pain in left shoulder: Secondary | ICD-10-CM | POA: Diagnosis not present

## 2017-11-04 DIAGNOSIS — M25612 Stiffness of left shoulder, not elsewhere classified: Secondary | ICD-10-CM | POA: Diagnosis not present

## 2017-11-04 DIAGNOSIS — S46012D Strain of muscle(s) and tendon(s) of the rotator cuff of left shoulder, subsequent encounter: Secondary | ICD-10-CM | POA: Diagnosis not present

## 2017-11-07 DIAGNOSIS — M25512 Pain in left shoulder: Secondary | ICD-10-CM | POA: Diagnosis not present

## 2017-11-07 DIAGNOSIS — M25612 Stiffness of left shoulder, not elsewhere classified: Secondary | ICD-10-CM | POA: Diagnosis not present

## 2017-11-07 DIAGNOSIS — S46012D Strain of muscle(s) and tendon(s) of the rotator cuff of left shoulder, subsequent encounter: Secondary | ICD-10-CM | POA: Diagnosis not present

## 2017-12-03 DIAGNOSIS — M25512 Pain in left shoulder: Secondary | ICD-10-CM | POA: Diagnosis not present

## 2018-01-07 DIAGNOSIS — H6123 Impacted cerumen, bilateral: Secondary | ICD-10-CM | POA: Diagnosis not present

## 2018-01-07 DIAGNOSIS — H6062 Unspecified chronic otitis externa, left ear: Secondary | ICD-10-CM | POA: Diagnosis not present

## 2018-01-07 DIAGNOSIS — H903 Sensorineural hearing loss, bilateral: Secondary | ICD-10-CM | POA: Diagnosis not present

## 2018-01-22 DIAGNOSIS — D485 Neoplasm of uncertain behavior of skin: Secondary | ICD-10-CM | POA: Diagnosis not present

## 2018-02-19 DIAGNOSIS — L309 Dermatitis, unspecified: Secondary | ICD-10-CM | POA: Diagnosis not present

## 2018-02-19 DIAGNOSIS — Z23 Encounter for immunization: Secondary | ICD-10-CM | POA: Diagnosis not present

## 2018-04-01 DIAGNOSIS — Z0000 Encounter for general adult medical examination without abnormal findings: Secondary | ICD-10-CM | POA: Diagnosis not present

## 2018-04-01 DIAGNOSIS — E7800 Pure hypercholesterolemia, unspecified: Secondary | ICD-10-CM | POA: Diagnosis not present

## 2018-04-01 DIAGNOSIS — I10 Essential (primary) hypertension: Secondary | ICD-10-CM | POA: Diagnosis not present

## 2018-04-01 DIAGNOSIS — Z125 Encounter for screening for malignant neoplasm of prostate: Secondary | ICD-10-CM | POA: Diagnosis not present

## 2018-04-01 DIAGNOSIS — I714 Abdominal aortic aneurysm, without rupture: Secondary | ICD-10-CM | POA: Diagnosis not present

## 2018-10-17 DIAGNOSIS — I10 Essential (primary) hypertension: Secondary | ICD-10-CM | POA: Diagnosis not present

## 2018-10-17 DIAGNOSIS — E7800 Pure hypercholesterolemia, unspecified: Secondary | ICD-10-CM | POA: Diagnosis not present

## 2018-10-22 DIAGNOSIS — I10 Essential (primary) hypertension: Secondary | ICD-10-CM | POA: Diagnosis not present

## 2018-10-22 DIAGNOSIS — E7800 Pure hypercholesterolemia, unspecified: Secondary | ICD-10-CM | POA: Diagnosis not present

## 2018-11-26 IMAGING — US US AORTA SCREENING (MEDICARE)
1 series · 9 of 9 positions shown · non-contrast
Comparison: No recent prior.

CLINICAL DATA: Screening exam.

EXAM:
US ABDOMINAL AORTA MEDICARE SCREENING
TECHNIQUE: Ultrasound examination of the abdominal aorta was performed as a
screening evaluation for abdominal aortic aneurysm.

[Series 1: us aorta screening (medicare) · 0.26mm/px · 9 of 9 slices shown]
[im 1/9]
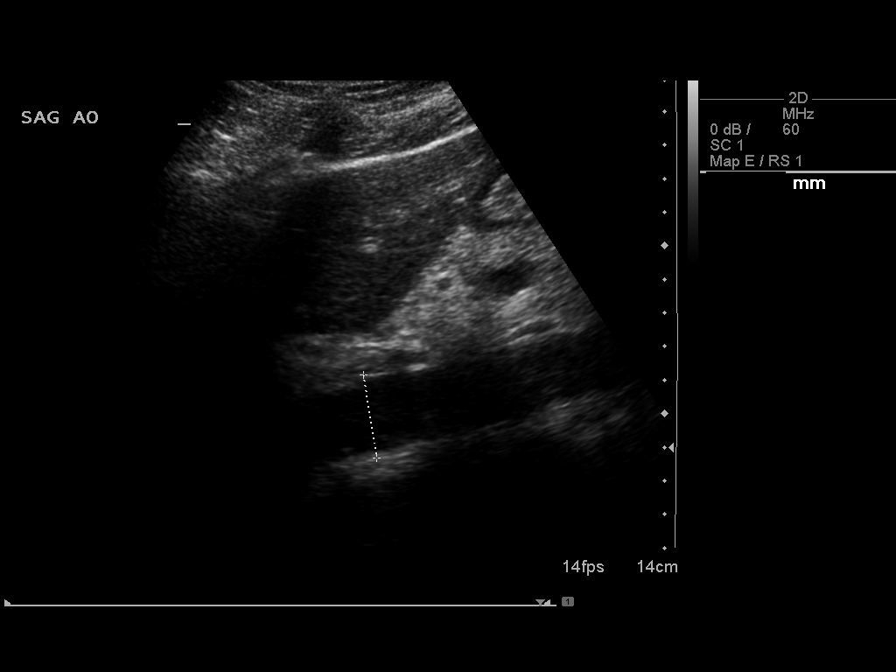
[im 2/9]
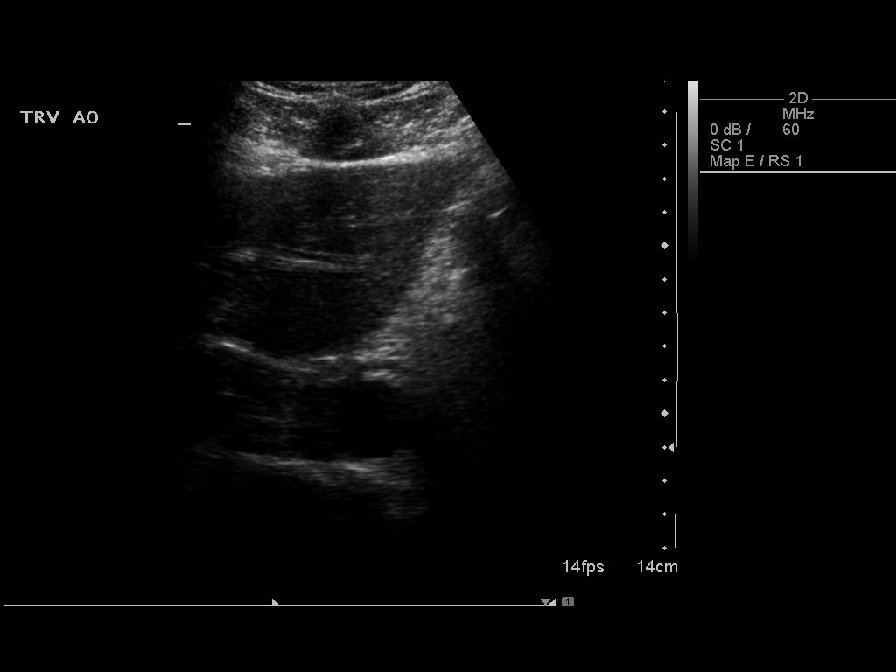
[im 3/9]
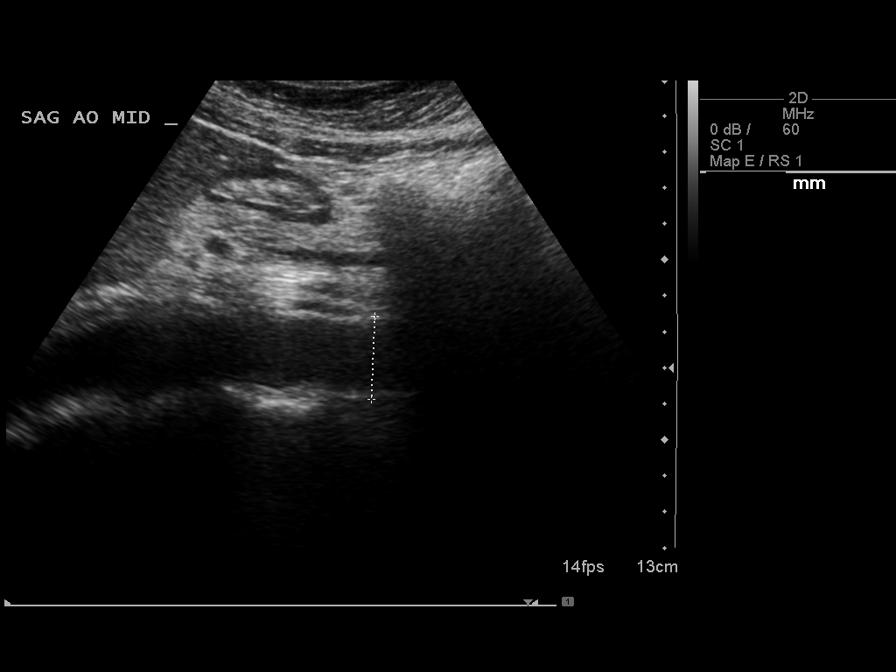
[im 4/9]
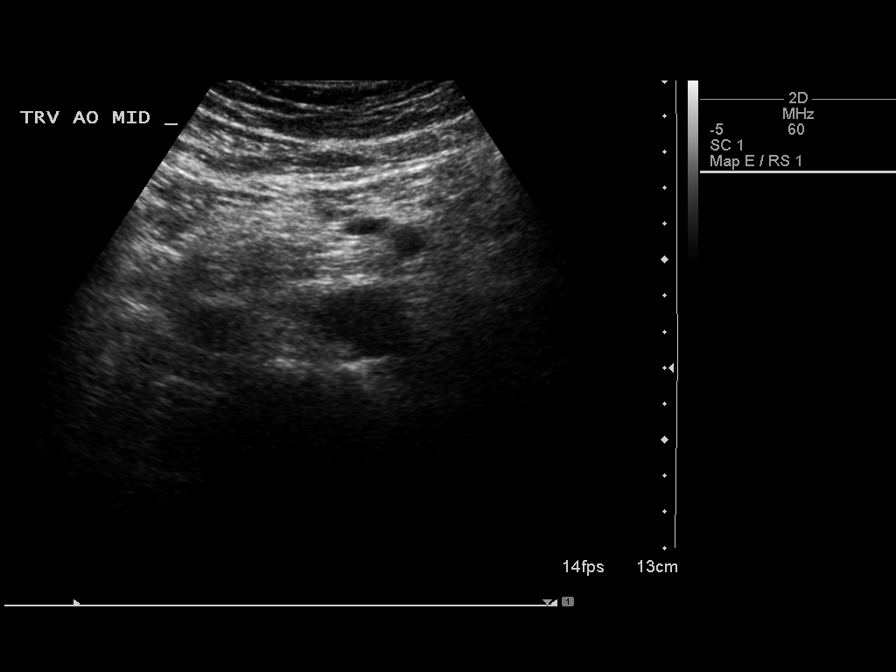
[im 5/9]
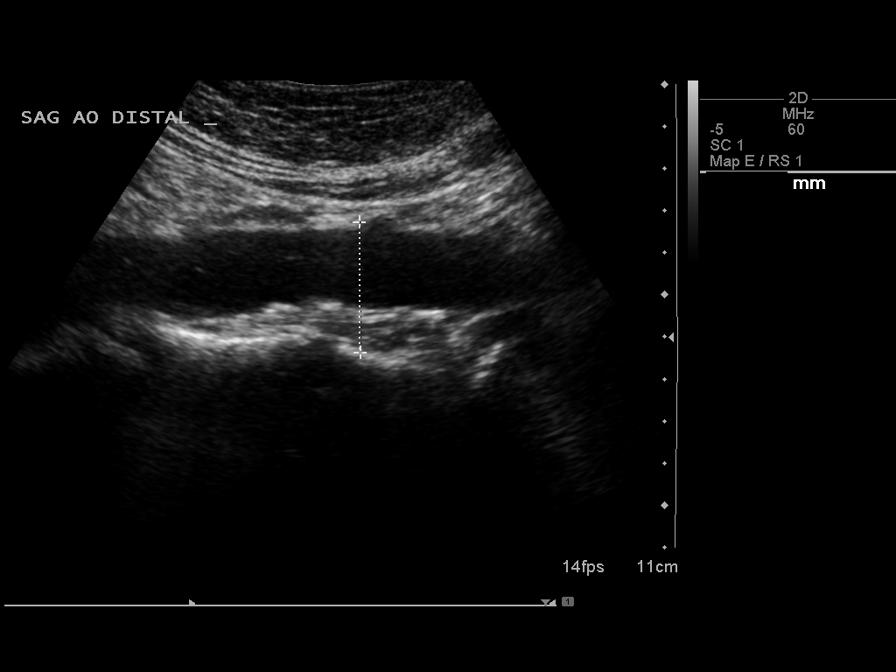
[im 6/9]
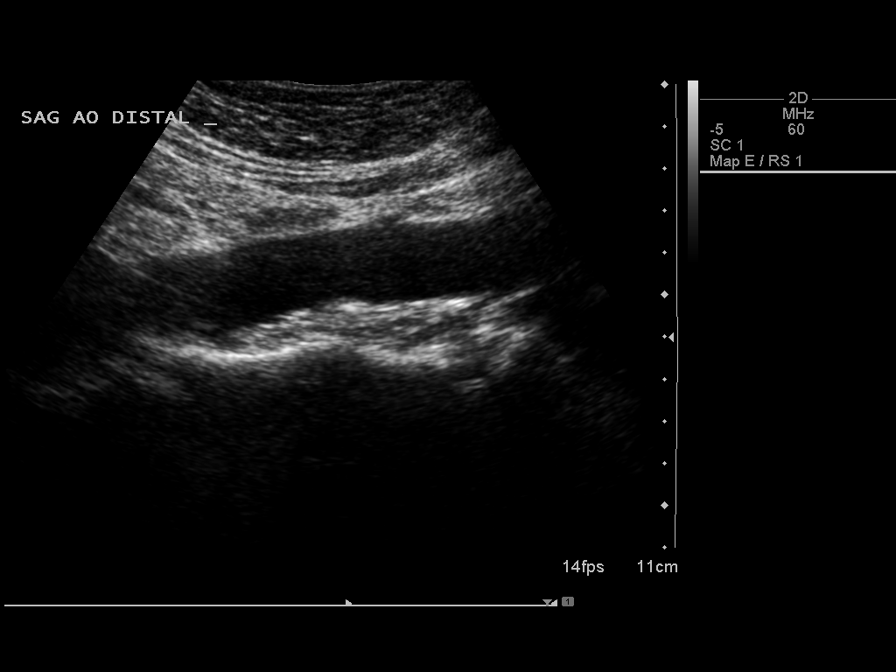
[im 7/9]
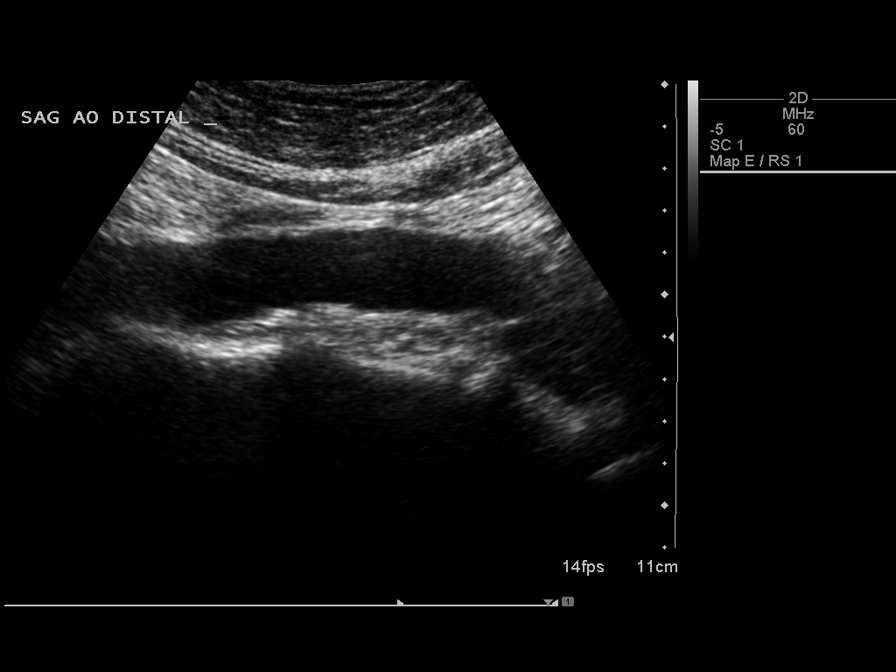
[im 8/9]
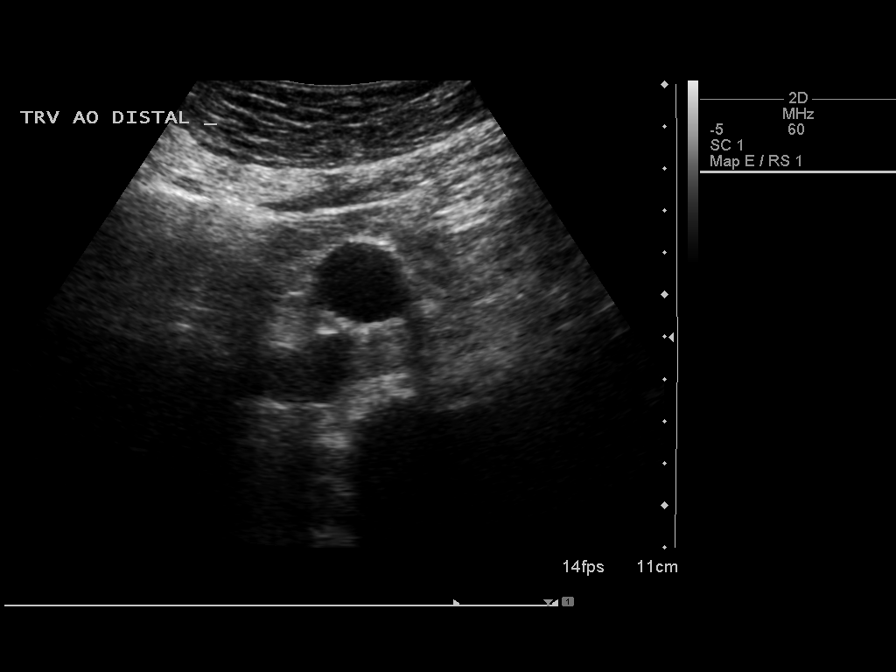
[im 9/9]
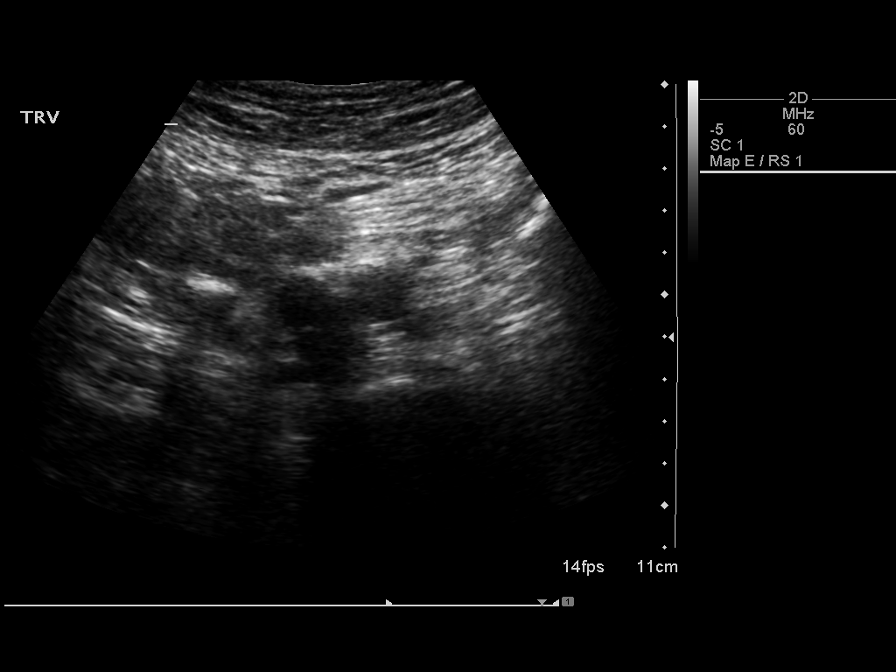

[9 of 9 positions shown; findings below may reference images not displayed]

FINDINGS: Maximum Diameter: 3.1 cm

ABDOMINAL AORTA

Proximal:  2.5 cm

Mid:  2.3 cm

Distal:  3.1 cm
IMPRESSION: 3.1 cm distal abdominal aortic aneurysm. Recommend followup by US in
3 years. This recommendation follows ACR consensus guidelines: White
Paper of the ACR Incidental Findings Committee II on Vascular
Findings. [HOSPITAL] 9828; [DATE]

## 2019-01-21 DIAGNOSIS — D225 Melanocytic nevi of trunk: Secondary | ICD-10-CM | POA: Diagnosis not present

## 2019-01-21 DIAGNOSIS — Z1283 Encounter for screening for malignant neoplasm of skin: Secondary | ICD-10-CM | POA: Diagnosis not present

## 2019-01-21 DIAGNOSIS — L570 Actinic keratosis: Secondary | ICD-10-CM | POA: Diagnosis not present

## 2019-01-21 DIAGNOSIS — X32XXXD Exposure to sunlight, subsequent encounter: Secondary | ICD-10-CM | POA: Diagnosis not present

## 2019-03-16 DIAGNOSIS — H52223 Regular astigmatism, bilateral: Secondary | ICD-10-CM | POA: Diagnosis not present

## 2019-03-16 DIAGNOSIS — H2513 Age-related nuclear cataract, bilateral: Secondary | ICD-10-CM | POA: Diagnosis not present

## 2019-03-16 DIAGNOSIS — H5203 Hypermetropia, bilateral: Secondary | ICD-10-CM | POA: Diagnosis not present

## 2019-03-16 DIAGNOSIS — H524 Presbyopia: Secondary | ICD-10-CM | POA: Diagnosis not present

## 2019-04-14 DIAGNOSIS — E7800 Pure hypercholesterolemia, unspecified: Secondary | ICD-10-CM | POA: Diagnosis not present

## 2019-04-14 DIAGNOSIS — Z125 Encounter for screening for malignant neoplasm of prostate: Secondary | ICD-10-CM | POA: Diagnosis not present

## 2019-04-20 DIAGNOSIS — Z0000 Encounter for general adult medical examination without abnormal findings: Secondary | ICD-10-CM | POA: Diagnosis not present

## 2019-04-20 DIAGNOSIS — I10 Essential (primary) hypertension: Secondary | ICD-10-CM | POA: Diagnosis not present

## 2019-04-20 DIAGNOSIS — E7800 Pure hypercholesterolemia, unspecified: Secondary | ICD-10-CM | POA: Diagnosis not present

## 2019-07-06 ENCOUNTER — Ambulatory Visit: Payer: PPO

## 2019-07-12 ENCOUNTER — Ambulatory Visit: Payer: PPO

## 2019-07-13 IMAGING — RF DG HIP (WITH PELVIS) OPERATIVE*R*
1 series · 3 of 3 positions shown · non-contrast
Comparison: None.

CLINICAL DATA: Right hip replacement

EXAM:
DG C-ARM 61-120 MIN; OPERATIVE RIGHT HIP WITH PELVIS

[Series 1: run · 3 of 3 slices shown]
[im 1/3]
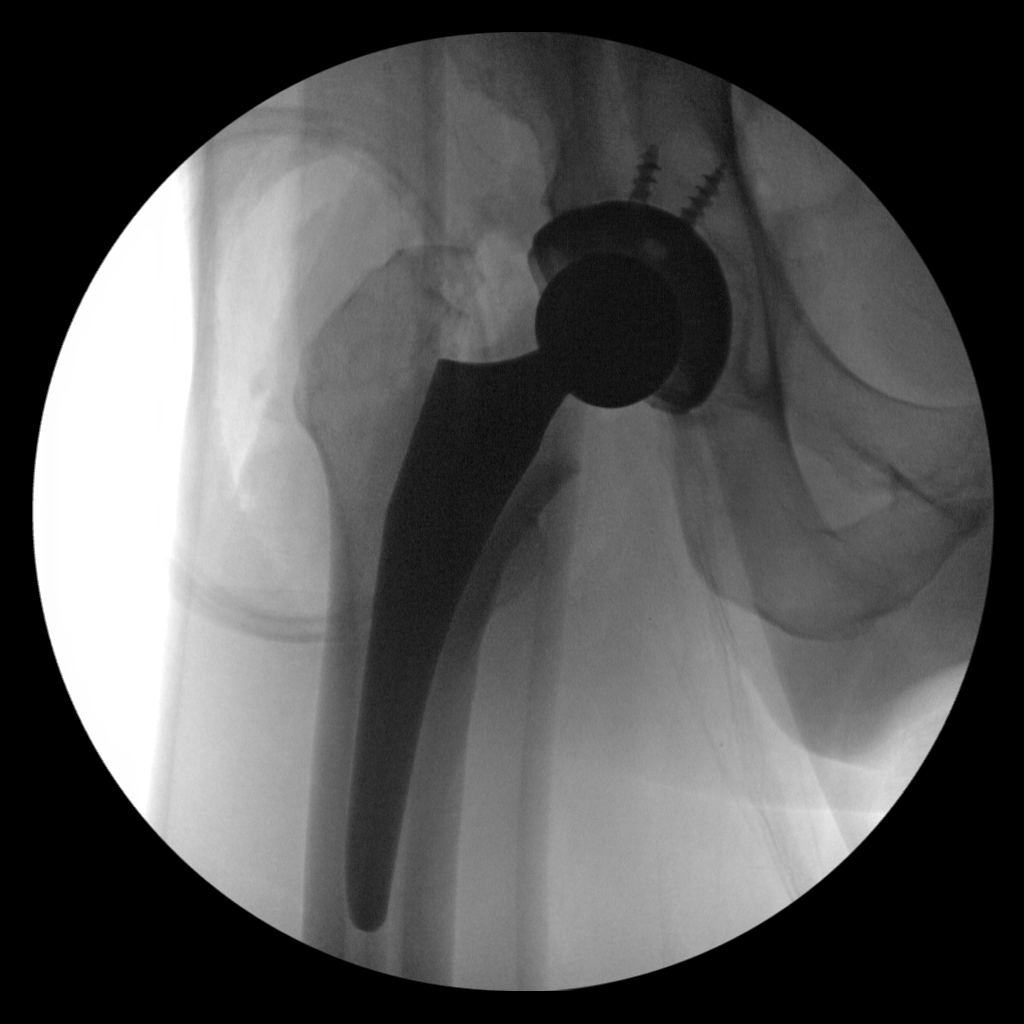
[im 2/3]
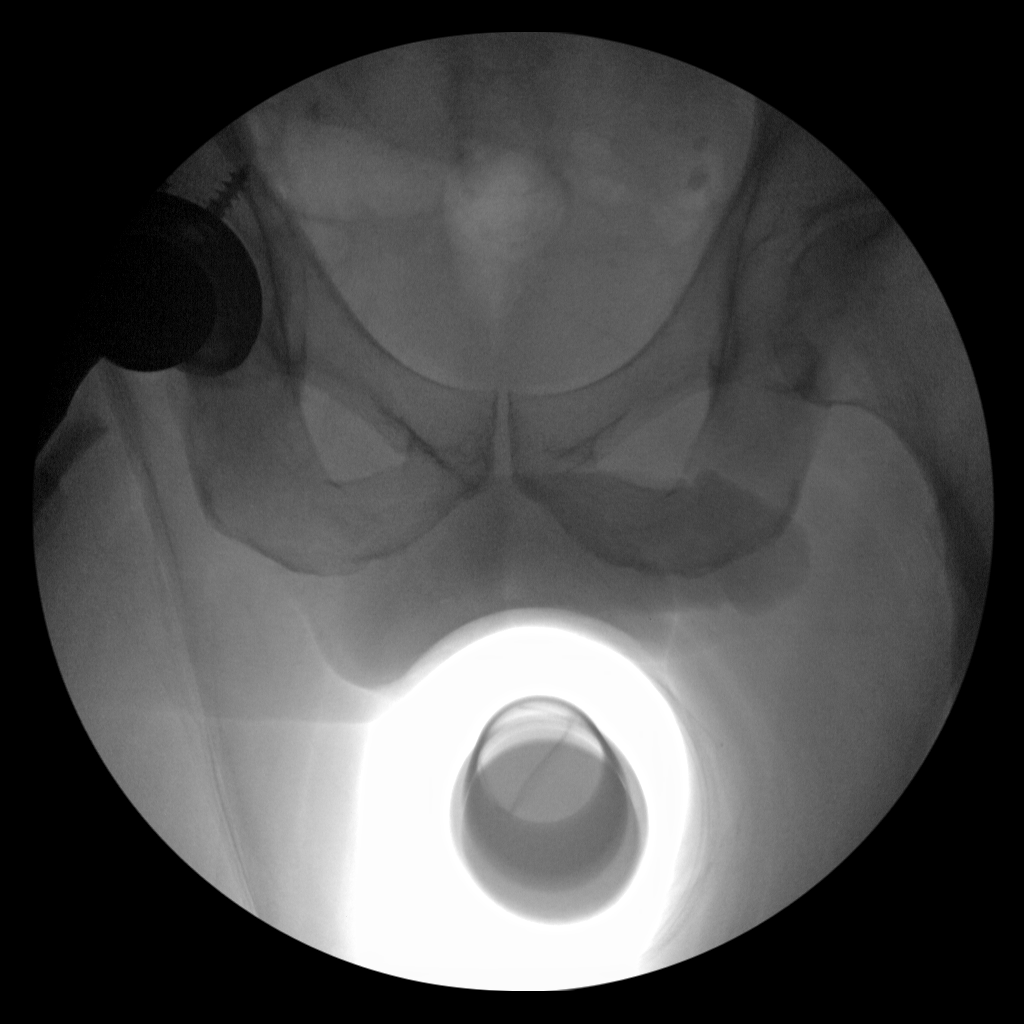
[im 3/3]
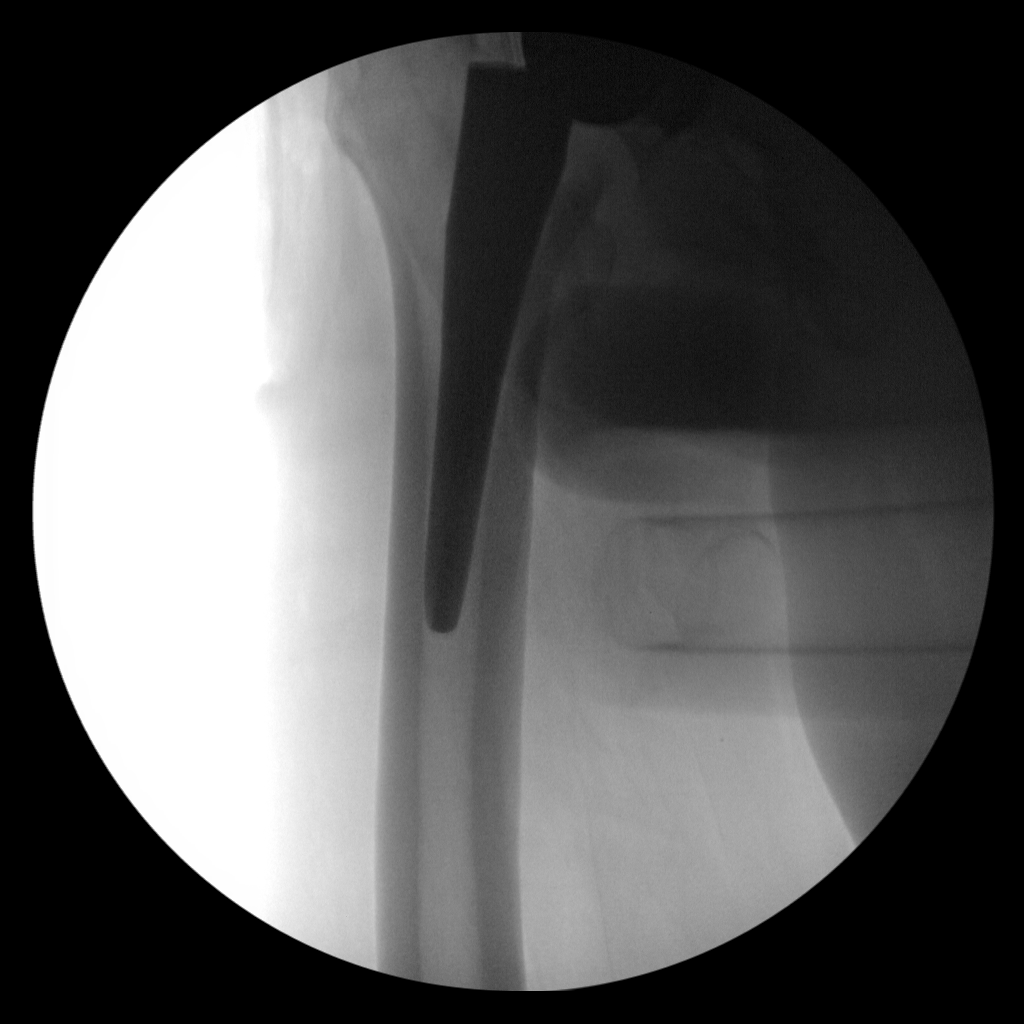

[3 of 3 positions shown; findings below may reference images not displayed]

FLUOROSCOPY TIME:  Fluoroscopy Time:  21 seconds

Radiation Exposure Index (if provided by the fluoroscopic device):
Not available

Number of Acquired Spot Images: 3
FINDINGS: Right hip replacement is noted in satisfactory position. No acute
bony abnormality is seen. No soft tissue changes are noted.
IMPRESSION: Right hip replacement

## 2019-07-23 ENCOUNTER — Ambulatory Visit: Payer: PPO

## 2019-08-25 DIAGNOSIS — E7800 Pure hypercholesterolemia, unspecified: Secondary | ICD-10-CM | POA: Diagnosis not present

## 2019-08-26 DIAGNOSIS — H25813 Combined forms of age-related cataract, bilateral: Secondary | ICD-10-CM | POA: Diagnosis not present

## 2019-08-26 DIAGNOSIS — H2513 Age-related nuclear cataract, bilateral: Secondary | ICD-10-CM | POA: Diagnosis not present

## 2019-08-27 DIAGNOSIS — M25552 Pain in left hip: Secondary | ICD-10-CM | POA: Diagnosis not present

## 2019-08-27 DIAGNOSIS — M545 Low back pain: Secondary | ICD-10-CM | POA: Diagnosis not present

## 2019-09-09 DIAGNOSIS — M25552 Pain in left hip: Secondary | ICD-10-CM | POA: Diagnosis not present

## 2019-09-24 DIAGNOSIS — H25013 Cortical age-related cataract, bilateral: Secondary | ICD-10-CM | POA: Diagnosis not present

## 2019-09-24 DIAGNOSIS — H18413 Arcus senilis, bilateral: Secondary | ICD-10-CM | POA: Diagnosis not present

## 2019-09-24 DIAGNOSIS — H25043 Posterior subcapsular polar age-related cataract, bilateral: Secondary | ICD-10-CM | POA: Diagnosis not present

## 2019-09-24 DIAGNOSIS — H2512 Age-related nuclear cataract, left eye: Secondary | ICD-10-CM | POA: Diagnosis not present

## 2019-09-24 DIAGNOSIS — H40023 Open angle with borderline findings, high risk, bilateral: Secondary | ICD-10-CM | POA: Diagnosis not present

## 2019-09-24 DIAGNOSIS — H2513 Age-related nuclear cataract, bilateral: Secondary | ICD-10-CM | POA: Diagnosis not present

## 2019-10-19 ENCOUNTER — Other Ambulatory Visit: Payer: Self-pay | Admitting: Family Medicine

## 2019-10-19 DIAGNOSIS — H9192 Unspecified hearing loss, left ear: Secondary | ICD-10-CM | POA: Diagnosis not present

## 2019-10-19 DIAGNOSIS — N529 Male erectile dysfunction, unspecified: Secondary | ICD-10-CM | POA: Diagnosis not present

## 2019-10-19 DIAGNOSIS — I714 Abdominal aortic aneurysm, without rupture, unspecified: Secondary | ICD-10-CM

## 2019-10-19 DIAGNOSIS — I10 Essential (primary) hypertension: Secondary | ICD-10-CM | POA: Diagnosis not present

## 2019-10-23 DIAGNOSIS — H6123 Impacted cerumen, bilateral: Secondary | ICD-10-CM | POA: Diagnosis not present

## 2019-10-23 DIAGNOSIS — H90A22 Sensorineural hearing loss, unilateral, left ear, with restricted hearing on the contralateral side: Secondary | ICD-10-CM | POA: Diagnosis not present

## 2019-10-23 DIAGNOSIS — H903 Sensorineural hearing loss, bilateral: Secondary | ICD-10-CM | POA: Diagnosis not present

## 2019-12-02 ENCOUNTER — Ambulatory Visit
Admission: RE | Admit: 2019-12-02 | Discharge: 2019-12-02 | Disposition: A | Discharge status: Home / Self Care | Payer: PPO | Source: Ambulatory Visit | Attending: Family Medicine | Admitting: Family Medicine

## 2019-12-02 DIAGNOSIS — I714 Abdominal aortic aneurysm, without rupture, unspecified: Secondary | ICD-10-CM

## 2019-12-04 DIAGNOSIS — N5201 Erectile dysfunction due to arterial insufficiency: Secondary | ICD-10-CM | POA: Diagnosis not present

## 2019-12-07 DIAGNOSIS — H2512 Age-related nuclear cataract, left eye: Secondary | ICD-10-CM | POA: Diagnosis not present

## 2019-12-07 DIAGNOSIS — H52202 Unspecified astigmatism, left eye: Secondary | ICD-10-CM | POA: Diagnosis not present

## 2019-12-07 DIAGNOSIS — Z961 Presence of intraocular lens: Secondary | ICD-10-CM | POA: Diagnosis not present

## 2019-12-07 DIAGNOSIS — Z9842 Cataract extraction status, left eye: Secondary | ICD-10-CM | POA: Diagnosis not present

## 2019-12-07 DIAGNOSIS — H52222 Regular astigmatism, left eye: Secondary | ICD-10-CM | POA: Diagnosis not present

## 2019-12-08 DIAGNOSIS — H2511 Age-related nuclear cataract, right eye: Secondary | ICD-10-CM | POA: Diagnosis not present

## 2019-12-21 DIAGNOSIS — H52201 Unspecified astigmatism, right eye: Secondary | ICD-10-CM | POA: Diagnosis not present

## 2019-12-21 DIAGNOSIS — H2511 Age-related nuclear cataract, right eye: Secondary | ICD-10-CM | POA: Diagnosis not present

## 2019-12-21 DIAGNOSIS — Z9841 Cataract extraction status, right eye: Secondary | ICD-10-CM | POA: Diagnosis not present

## 2019-12-21 DIAGNOSIS — H40051 Ocular hypertension, right eye: Secondary | ICD-10-CM | POA: Diagnosis not present

## 2019-12-21 DIAGNOSIS — H5203 Hypermetropia, bilateral: Secondary | ICD-10-CM | POA: Diagnosis not present

## 2019-12-21 DIAGNOSIS — H52223 Regular astigmatism, bilateral: Secondary | ICD-10-CM | POA: Diagnosis not present

## 2019-12-21 DIAGNOSIS — Z961 Presence of intraocular lens: Secondary | ICD-10-CM | POA: Diagnosis not present

## 2020-01-20 DIAGNOSIS — D225 Melanocytic nevi of trunk: Secondary | ICD-10-CM | POA: Diagnosis not present

## 2020-01-20 DIAGNOSIS — Z1283 Encounter for screening for malignant neoplasm of skin: Secondary | ICD-10-CM | POA: Diagnosis not present

## 2020-01-20 DIAGNOSIS — X32XXXD Exposure to sunlight, subsequent encounter: Secondary | ICD-10-CM | POA: Diagnosis not present

## 2020-01-20 DIAGNOSIS — L570 Actinic keratosis: Secondary | ICD-10-CM | POA: Diagnosis not present

## 2020-02-11 DIAGNOSIS — H52223 Regular astigmatism, bilateral: Secondary | ICD-10-CM | POA: Diagnosis not present

## 2020-02-11 DIAGNOSIS — H5203 Hypermetropia, bilateral: Secondary | ICD-10-CM | POA: Diagnosis not present

## 2020-02-11 DIAGNOSIS — L209 Atopic dermatitis, unspecified: Secondary | ICD-10-CM | POA: Diagnosis not present

## 2020-03-28 DIAGNOSIS — M5416 Radiculopathy, lumbar region: Secondary | ICD-10-CM | POA: Diagnosis not present

## 2020-03-30 DIAGNOSIS — H903 Sensorineural hearing loss, bilateral: Secondary | ICD-10-CM | POA: Diagnosis not present

## 2020-04-04 DIAGNOSIS — M48061 Spinal stenosis, lumbar region without neurogenic claudication: Secondary | ICD-10-CM | POA: Diagnosis not present

## 2020-04-04 DIAGNOSIS — M47816 Spondylosis without myelopathy or radiculopathy, lumbar region: Secondary | ICD-10-CM | POA: Diagnosis not present

## 2020-04-04 DIAGNOSIS — M545 Low back pain, unspecified: Secondary | ICD-10-CM | POA: Diagnosis not present

## 2020-04-18 DIAGNOSIS — M5126 Other intervertebral disc displacement, lumbar region: Secondary | ICD-10-CM | POA: Diagnosis not present

## 2020-04-18 DIAGNOSIS — M5416 Radiculopathy, lumbar region: Secondary | ICD-10-CM | POA: Diagnosis not present

## 2020-05-02 DIAGNOSIS — Z125 Encounter for screening for malignant neoplasm of prostate: Secondary | ICD-10-CM | POA: Diagnosis not present

## 2020-05-02 DIAGNOSIS — E7800 Pure hypercholesterolemia, unspecified: Secondary | ICD-10-CM | POA: Diagnosis not present

## 2020-05-02 DIAGNOSIS — I10 Essential (primary) hypertension: Secondary | ICD-10-CM | POA: Diagnosis not present

## 2020-05-02 DIAGNOSIS — Z23 Encounter for immunization: Secondary | ICD-10-CM | POA: Diagnosis not present

## 2020-05-02 DIAGNOSIS — M5416 Radiculopathy, lumbar region: Secondary | ICD-10-CM | POA: Diagnosis not present

## 2020-05-02 DIAGNOSIS — Z0000 Encounter for general adult medical examination without abnormal findings: Secondary | ICD-10-CM | POA: Diagnosis not present

## 2020-05-18 DIAGNOSIS — M5416 Radiculopathy, lumbar region: Secondary | ICD-10-CM | POA: Diagnosis not present

## 2020-06-10 DIAGNOSIS — Z20822 Contact with and (suspected) exposure to covid-19: Secondary | ICD-10-CM | POA: Diagnosis not present

## 2020-06-22 DIAGNOSIS — M5416 Radiculopathy, lumbar region: Secondary | ICD-10-CM | POA: Diagnosis not present

## 2020-09-18 DIAGNOSIS — Z20822 Contact with and (suspected) exposure to covid-19: Secondary | ICD-10-CM | POA: Diagnosis not present

## 2020-11-16 DIAGNOSIS — M5416 Radiculopathy, lumbar region: Secondary | ICD-10-CM | POA: Diagnosis not present

## 2020-12-01 DIAGNOSIS — N5201 Erectile dysfunction due to arterial insufficiency: Secondary | ICD-10-CM | POA: Diagnosis not present

## 2020-12-19 DIAGNOSIS — M47816 Spondylosis without myelopathy or radiculopathy, lumbar region: Secondary | ICD-10-CM | POA: Diagnosis not present

## 2021-01-06 DIAGNOSIS — M25561 Pain in right knee: Secondary | ICD-10-CM | POA: Diagnosis not present

## 2021-01-06 DIAGNOSIS — M25562 Pain in left knee: Secondary | ICD-10-CM | POA: Diagnosis not present

## 2021-01-06 DIAGNOSIS — M25551 Pain in right hip: Secondary | ICD-10-CM | POA: Diagnosis not present

## 2021-01-18 DIAGNOSIS — L821 Other seborrheic keratosis: Secondary | ICD-10-CM | POA: Diagnosis not present

## 2021-01-18 DIAGNOSIS — D225 Melanocytic nevi of trunk: Secondary | ICD-10-CM | POA: Diagnosis not present

## 2021-01-18 DIAGNOSIS — Z1283 Encounter for screening for malignant neoplasm of skin: Secondary | ICD-10-CM | POA: Diagnosis not present

## 2021-01-18 DIAGNOSIS — D485 Neoplasm of uncertain behavior of skin: Secondary | ICD-10-CM | POA: Diagnosis not present

## 2021-02-08 DIAGNOSIS — S60032A Contusion of left middle finger without damage to nail, initial encounter: Secondary | ICD-10-CM | POA: Diagnosis not present

## 2021-02-08 DIAGNOSIS — M79642 Pain in left hand: Secondary | ICD-10-CM | POA: Diagnosis not present

## 2021-04-18 DIAGNOSIS — L820 Inflamed seborrheic keratosis: Secondary | ICD-10-CM | POA: Diagnosis not present

## 2021-04-18 DIAGNOSIS — D225 Melanocytic nevi of trunk: Secondary | ICD-10-CM | POA: Diagnosis not present

## 2021-04-18 DIAGNOSIS — L821 Other seborrheic keratosis: Secondary | ICD-10-CM | POA: Diagnosis not present

## 2021-05-17 DIAGNOSIS — N529 Male erectile dysfunction, unspecified: Secondary | ICD-10-CM | POA: Diagnosis not present

## 2021-05-17 DIAGNOSIS — Z125 Encounter for screening for malignant neoplasm of prostate: Secondary | ICD-10-CM | POA: Diagnosis not present

## 2021-05-17 DIAGNOSIS — E7800 Pure hypercholesterolemia, unspecified: Secondary | ICD-10-CM | POA: Diagnosis not present

## 2021-05-17 DIAGNOSIS — Z0000 Encounter for general adult medical examination without abnormal findings: Secondary | ICD-10-CM | POA: Diagnosis not present

## 2021-05-17 DIAGNOSIS — I10 Essential (primary) hypertension: Secondary | ICD-10-CM | POA: Diagnosis not present

## 2021-06-21 DIAGNOSIS — H6123 Impacted cerumen, bilateral: Secondary | ICD-10-CM | POA: Diagnosis not present

## 2021-06-27 DIAGNOSIS — H903 Sensorineural hearing loss, bilateral: Secondary | ICD-10-CM | POA: Diagnosis not present

## 2021-07-05 DIAGNOSIS — S6992XA Unspecified injury of left wrist, hand and finger(s), initial encounter: Secondary | ICD-10-CM | POA: Diagnosis not present

## 2021-07-05 DIAGNOSIS — M25542 Pain in joints of left hand: Secondary | ICD-10-CM | POA: Diagnosis not present

## 2021-07-06 DIAGNOSIS — Z86010 Personal history of colonic polyps: Secondary | ICD-10-CM | POA: Diagnosis not present

## 2021-07-06 DIAGNOSIS — D123 Benign neoplasm of transverse colon: Secondary | ICD-10-CM | POA: Diagnosis not present

## 2021-07-12 DIAGNOSIS — D123 Benign neoplasm of transverse colon: Secondary | ICD-10-CM | POA: Diagnosis not present

## 2021-12-12 DIAGNOSIS — Z1283 Encounter for screening for malignant neoplasm of skin: Secondary | ICD-10-CM | POA: Diagnosis not present

## 2021-12-12 DIAGNOSIS — L820 Inflamed seborrheic keratosis: Secondary | ICD-10-CM | POA: Diagnosis not present

## 2021-12-12 DIAGNOSIS — D225 Melanocytic nevi of trunk: Secondary | ICD-10-CM | POA: Diagnosis not present

## 2022-01-01 DIAGNOSIS — D649 Anemia, unspecified: Secondary | ICD-10-CM | POA: Diagnosis not present

## 2022-01-01 DIAGNOSIS — R634 Abnormal weight loss: Secondary | ICD-10-CM | POA: Diagnosis not present

## 2022-01-01 DIAGNOSIS — R7989 Other specified abnormal findings of blood chemistry: Secondary | ICD-10-CM | POA: Diagnosis not present

## 2022-02-05 DIAGNOSIS — D649 Anemia, unspecified: Secondary | ICD-10-CM | POA: Diagnosis not present

## 2022-02-05 DIAGNOSIS — E871 Hypo-osmolality and hyponatremia: Secondary | ICD-10-CM | POA: Diagnosis not present

## 2022-02-14 ENCOUNTER — Telehealth: Payer: Self-pay | Admitting: Hematology and Oncology

## 2022-02-14 NOTE — Telephone Encounter (Signed)
Scheduled appt per 9/20 referral. Pt is aware of appt date and time. Pt is aware to arrive 15 mins prior to appt time and to bring and updated insurance card. Pt is aware of appt location.   

## 2022-02-23 DIAGNOSIS — R948 Abnormal results of function studies of other organs and systems: Secondary | ICD-10-CM | POA: Diagnosis not present

## 2022-02-23 DIAGNOSIS — N5201 Erectile dysfunction due to arterial insufficiency: Secondary | ICD-10-CM | POA: Diagnosis not present

## 2022-02-23 DIAGNOSIS — Z125 Encounter for screening for malignant neoplasm of prostate: Secondary | ICD-10-CM | POA: Diagnosis not present

## 2022-03-02 DIAGNOSIS — M25512 Pain in left shoulder: Secondary | ICD-10-CM | POA: Diagnosis not present

## 2022-03-02 DIAGNOSIS — M25561 Pain in right knee: Secondary | ICD-10-CM | POA: Diagnosis not present

## 2022-03-02 DIAGNOSIS — M25551 Pain in right hip: Secondary | ICD-10-CM | POA: Diagnosis not present

## 2022-03-02 DIAGNOSIS — M25511 Pain in right shoulder: Secondary | ICD-10-CM | POA: Diagnosis not present

## 2022-03-02 DIAGNOSIS — M25562 Pain in left knee: Secondary | ICD-10-CM | POA: Diagnosis not present

## 2022-03-27 ENCOUNTER — Inpatient Hospital Stay: Payer: PPO | Attending: Hematology and Oncology | Admitting: Hematology and Oncology

## 2022-03-27 ENCOUNTER — Encounter: Payer: Self-pay | Admitting: Hematology and Oncology

## 2022-03-27 ENCOUNTER — Inpatient Hospital Stay: Payer: PPO

## 2022-03-27 VITALS — BP 144/77 | HR 50 | Temp 97.9°F | Resp 18 | Ht 72.0 in | Wt 171.0 lb

## 2022-03-27 DIAGNOSIS — Z87891 Personal history of nicotine dependence: Secondary | ICD-10-CM | POA: Diagnosis not present

## 2022-03-27 DIAGNOSIS — D509 Iron deficiency anemia, unspecified: Secondary | ICD-10-CM | POA: Insufficient documentation

## 2022-03-27 DIAGNOSIS — I10 Essential (primary) hypertension: Secondary | ICD-10-CM | POA: Diagnosis not present

## 2022-03-27 DIAGNOSIS — D539 Nutritional anemia, unspecified: Secondary | ICD-10-CM

## 2022-03-27 DIAGNOSIS — Z803 Family history of malignant neoplasm of breast: Secondary | ICD-10-CM | POA: Insufficient documentation

## 2022-03-27 LAB — COMPREHENSIVE METABOLIC PANEL
ALT: 11 U/L (ref 0–44)
AST: 19 U/L (ref 15–41)
Albumin: 4.4 g/dL (ref 3.5–5.0)
Alkaline Phosphatase: 59 U/L (ref 38–126)
Anion gap: 4 — ABNORMAL LOW (ref 5–15)
BUN: 10 mg/dL (ref 8–23)
CO2: 30 mmol/L (ref 22–32)
Calcium: 9.2 mg/dL (ref 8.9–10.3)
Chloride: 100 mmol/L (ref 98–111)
Creatinine, Ser: 0.85 mg/dL (ref 0.61–1.24)
GFR, Estimated: >60 mL/min (ref >60)
Glucose, Bld: 92 mg/dL (ref 70–99)
Potassium: 4.7 mmol/L (ref 3.5–5.1)
Sodium: 134 mmol/L — ABNORMAL LOW (ref 135–145)
Total Bilirubin: 0.6 mg/dL (ref 0.3–1.2)
Total Protein: 7.2 g/dL (ref 6.5–8.1)

## 2022-03-27 LAB — CBC WITH DIFFERENTIAL/PLATELET
Abs Immature Granulocytes: 0.01 10*3/uL (ref 0.00–0.07)
Basophils Absolute: 0.1 10*3/uL (ref 0.0–0.1)
Basophils Relative: 1 %
Eosinophils Absolute: 0.2 10*3/uL (ref 0.0–0.5)
Eosinophils Relative: 3 %
HCT: 38.8 % — ABNORMAL LOW (ref 39.0–52.0)
Hemoglobin: 13.9 g/dL (ref 13.0–17.0)
Immature Granulocytes: 0 %
Lymphocytes Relative: 34 %
Lymphs Abs: 1.7 10*3/uL (ref 0.7–4.0)
MCH: 33.2 pg (ref 26.0–34.0)
MCHC: 35.8 g/dL (ref 30.0–36.0)
MCV: 92.6 fL (ref 80.0–100.0)
Monocytes Absolute: 0.6 10*3/uL (ref 0.1–1.0)
Monocytes Relative: 13 %
Neutro Abs: 2.5 10*3/uL (ref 1.7–7.7)
Neutrophils Relative %: 49 %
Platelets: 186 10*3/uL (ref 150–400)
RBC: 4.19 MIL/uL — ABNORMAL LOW (ref 4.22–5.81)
RDW: 13.2 % (ref 11.5–15.5)
WBC: 5 10*3/uL (ref 4.0–10.5)
nRBC: 0 % (ref 0.0–0.2)

## 2022-03-27 LAB — VITAMIN B12: Vitamin B-12: 357 pg/mL (ref 180–914)

## 2022-03-27 LAB — TECHNOLOGIST SMEAR REVIEW: Plt Morphology: NORMAL

## 2022-03-27 LAB — TSH: TSH: 2.572 u[IU]/mL (ref 0.350–4.500)

## 2022-03-27 NOTE — Progress Notes (Signed)
[facility] NOTE  Patient Care Team: Rankins, Bill Salinas, MD as PCP - General (Family Medicine)  CHIEF COMPLAINTS/PURPOSE OF CONSULTATION:  Anemia.  ASSESSMENT & PLAN:   This is a very pleasant 76 year old male patient with past medical history significant for hypertension referred to hematology for evaluation of mild microcytic anemia.  He denies any complaints except for some weight loss in the past 6 weeks of about 10 pounds.  This could also be secondary to conscious eating.  Rest of the pertinent 10 point ROS reviewed and negative.  Physical examination today unremarkable, no palpable lymphadenopathy or hepatosplenomegaly.  We have discussed about repeating labs and doing some investigation including B12/folic acid deficiency, TSH, smear review.  His repeat labs from today shows resolution of anemia and macrocytosis.  Rest of the labs are pending.  At this time since he does not have any active anemia, we will encourage him to stay in touch with his PCP for monitoring of the weight loss.  In the absence of other correlating review of systems, we do not really recommend routine imaging to evaluate for weight loss.  He is certainly will benefit from monitoring.  Family history significant for breast cancer in mom at the age of 28, no other cancers.  I have recommended age-appropriate cancer screening. We will relay the results from today to him if we note any abnormalities.  Otherwise we will cancel his future follow-up and he can return to clinic with [place] as needed.   HISTORY OF PRESENTING ILLNESS:  Kenneth Wagner 76 y.o. male is here because of anemia.  This is a very pleasant 76 year old male patient with past medical history significant for hypertension referred to hematology for evaluation of mild macrocytic anemia.  Patient also reported about 10 pound weight loss in the past 6 weeks to his primary care provider.  He was found to have some mild macrocytosis hence  referred to hematology for additional investigation.  He tells me that he is very active, eats healthy and may have been eating more consciously to reduce his glucose levels.  He does not feel unwell at all.  No fevers, drenching night sweats, loss of appetite.  He denies any recent infections or hospitalizations.  He denies any known nutritional deficiency.  No purulence or autoimmune diseases.  He does drink 2 glasses of white wine daily.  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   Rest of the pertinent 10 point ROS reviewed and negative  MEDICAL HISTORY:  Past Medical History:  Diagnosis Date   Allergy    Arthritis    Coronary artery disease    H/O hiatal hernia    Hypertension    No pertinent past medical history     SURGICAL HISTORY: Past Surgical History:  Procedure Laterality Date   CARDIAC CATHETERIZATION  5/12   CARPAL TUNNEL RELEASE  08/13/2011   Procedure: CARPAL TUNNEL RELEASE;  Surgeon: Wynonia Sours, MD;  Location: [facility];  Service: Orthopedics;  Laterality: Left;   COLONOSCOPY     FOREIGN BODY REMOVAL  08/13/2011   Procedure: FOREIGN BODY REMOVAL ADULT;  Surgeon: Wynonia Sours, MD;  Location: [place];  Service: Orthopedics;  Laterality: Left;  removal foreign body index finger metacarpal phalangeal   JOINT REPLACEMENT  2010   lt mod total knee   JOINT REPLACEMENT  2008   rt total knee   KNEE ARTHROPLASTY     rt and lt as young  man   KNEE ARTHROSCOPY     right and left   ORIF FINGER FRACTURE  4/11   rt index   RESECTION DISTAL CLAVICAL Right 02/21/2017   Procedure: DISTAL CLAVICLE EXCISION;  Surgeon: [name], MD;  Location: [facility];  Service: Orthopedics;  Laterality: Right;   SHOULDER ARTHROSCOPY WITH ROTATOR CUFF REPAIR AND SUBACROMIAL DECOMPRESSION Right 02/21/2017   Procedure: RIGHT SHOULDER ARTHROSCOPY WITH DEBRIDEMENT, ACROMIOPLASTY, ROTATOR CUFF REPAIR;  Surgeon: [name], MD;  Location: [place];  Service: Orthopedics;  Laterality: Right;   SHOULDER ARTHROSCOPY WITH SUBACROMIAL DECOMPRESSION, ROTATOR CUFF REPAIR AND BICEP TENDON REPAIR Left 07/11/2017   Procedure: LEFT SHOULDER ARTHROSCOPY DEBRIDEMENT WITH SUBACROMIAL DECOMPRESSION, ROTATOR CUFF REPAIR AND BICEP TENODESIS;  Surgeon: Hiram Gash, MD;  Location: [place];  Service: Orthopedics;  Laterality: Left;   TONSILLECTOMY     TOTAL HIP ARTHROPLASTY Right 12/12/2016   TOTAL HIP ARTHROPLASTY Right 12/12/2016   Procedure: TOTAL HIP ARTHROPLASTY ANTERIOR APPROACH;  Surgeon: [name], MD;  Location: [facility];  Service: Orthopedics;  Laterality: Right;    SOCIAL HISTORY: Social History   Socioeconomic History   Marital status: Married    Spouse name: Not on file   Number of children: Not on file   Years of education: Not on file   Highest education level: Not on file  Occupational History   Not on file  Tobacco Use   Smoking status: Former    Types: Cigarettes    Quit date: 08/07/1971    Years since quitting: 50.6   Smokeless tobacco: Never  Vaping Use   Vaping Use: Never used  Substance and Sexual Activity   Alcohol use: Yes    Comment: daily   Drug use: No   Sexual activity: Not on file  Other Topics Concern   Not on file  Social History Narrative   Not on file   Social Determinants of Health   Financial Resource Strain: Not on file  Food Insecurity: Not on file  Transportation Needs: Not on file  Physical Activity: Not on file  Stress: Not on file  Social Connections: Not on file  Intimate Partner Violence: Not on file    FAMILY HISTORY: Family History  Problem Relation Age of Onset   Heart disease Brother    Hyperlipidemia Brother     ALLERGIES:  is allergic to bee venom and lodine [etodolac].  MEDICATIONS:  Current Outpatient Medications  Medication Sig Dispense Refill   aspirin EC 81 MG tablet Take 81 mg by mouth daily.     atorvastatin (LIPITOR) 20  MG tablet Take 20 mg by mouth every other day.     losartan (COZAAR) 25 MG tablet Take 25 mg by mouth daily.     omeprazole (PRILOSEC) 20 MG capsule Take 1 capsule (20 mg total) by mouth daily for 14 days. 14 capsule 0   sildenafil (REVATIO) 20 MG tablet Take 60 mg by mouth as needed (for ED).     No current facility-administered medications for this visit.     PHYSICAL EXAMINATION:  ECOG PERFORMANCE STATUS: 0 - Asymptomatic  Vitals:   03/27/22 1020  BP: (!) 144/77  Pulse: (!) 50  Resp: 18  Temp: 97.9 F (36.6 C)  SpO2: 100%   Filed Weights   03/27/22 1020  Weight: 171 lb (77.6 kg)    Physical Exam Constitutional:      Appearance: Normal appearance.  Cardiovascular:  Rate and Rhythm: Normal rate and regular rhythm.  Pulmonary:     Effort: Pulmonary effort is normal.     Breath sounds: Normal breath sounds.  Abdominal:     General: Abdomen is flat. Bowel sounds are normal.     Palpations: Abdomen is soft.  Musculoskeletal:        General: No swelling.     Cervical back: Normal range of motion and neck supple. No rigidity.  Lymphadenopathy:     Cervical: No cervical adenopathy.  Skin:    General: Skin is warm and dry.  Neurological:     General: No focal deficit present.     Mental Status: He is alert.      LABORATORY DATA:  I have reviewed the data as listed Lab Results  Component Value Date   WBC 11.8 (H) 12/13/2016   HGB 10.6 (L) 12/13/2016   HCT 31.1 (L) 12/13/2016   MCV 94.0 12/13/2016   PLT 190 12/13/2016     Chemistry      Component Value Date/Time   NA 136 12/13/2016 0424   K 4.3 12/13/2016 0424   CL 103 12/13/2016 0424   CO2 27 12/13/2016 0424   BUN 9 12/13/2016 0424   CREATININE 0.94 12/13/2016 0424      Component Value Date/Time   CALCIUM 8.1 (L) 12/13/2016 0424   ALKPHOS 54 12/03/2016 1122   AST 23 12/03/2016 1122   ALT 18 12/03/2016 1122   BILITOT 0.7 12/03/2016 1122     I reviewed his labs which showed a hemoglobin of 12.8  and MCV of 100.  He has had anemia in the past.  We do not have other more recent labs to compare. CBC from today actually shows normal hemoglobin at 13.9 and MCV of 92.6, normal white blood cell count and platelet count.  RADIOGRAPHIC STUDIES: I have personally reviewed the radiological images as listed and agreed with the findings in the report. No results found.  All questions were answered. The patient knows to call the clinic with any problems, questions or concerns. I spent 45 minutes in the care of this patient including H and P, review of records, counseling and coordination of care.     Benay Pike, MD 03/27/2022 10:36 AM

## 2022-03-28 LAB — FOLATE RBC
Folate, Hemolysate: 324 ng/mL
Folate, RBC: 824 ng/mL (ref >498)
Hematocrit: 39.3 % (ref 37.5–51.0)

## 2022-03-28 LAB — ANTINUCLEAR ANTIBODIES, IFA: ANA Ab, IFA: NEGATIVE

## 2022-04-06 ENCOUNTER — Encounter: Payer: Self-pay | Admitting: Hematology and Oncology

## 2022-04-10 DIAGNOSIS — E785 Hyperlipidemia, unspecified: Secondary | ICD-10-CM | POA: Diagnosis not present

## 2022-04-10 DIAGNOSIS — M1990 Unspecified osteoarthritis, unspecified site: Secondary | ICD-10-CM | POA: Diagnosis not present

## 2022-04-10 DIAGNOSIS — Z7982 Long term (current) use of aspirin: Secondary | ICD-10-CM | POA: Diagnosis not present

## 2022-04-10 DIAGNOSIS — I714 Abdominal aortic aneurysm, without rupture, unspecified: Secondary | ICD-10-CM | POA: Diagnosis not present

## 2022-04-10 DIAGNOSIS — G629 Polyneuropathy, unspecified: Secondary | ICD-10-CM | POA: Diagnosis not present

## 2022-04-10 DIAGNOSIS — I10 Essential (primary) hypertension: Secondary | ICD-10-CM | POA: Diagnosis not present

## 2022-04-10 DIAGNOSIS — Z87891 Personal history of nicotine dependence: Secondary | ICD-10-CM | POA: Diagnosis not present

## 2022-04-27 DIAGNOSIS — E291 Testicular hypofunction: Secondary | ICD-10-CM | POA: Diagnosis not present

## 2022-04-27 DIAGNOSIS — R948 Abnormal results of function studies of other organs and systems: Secondary | ICD-10-CM | POA: Diagnosis not present

## 2022-05-04 DIAGNOSIS — N5201 Erectile dysfunction due to arterial insufficiency: Secondary | ICD-10-CM | POA: Diagnosis not present

## 2022-05-04 DIAGNOSIS — E291 Testicular hypofunction: Secondary | ICD-10-CM | POA: Diagnosis not present

## 2022-05-22 DIAGNOSIS — E291 Testicular hypofunction: Secondary | ICD-10-CM | POA: Diagnosis not present

## 2022-05-29 ENCOUNTER — Inpatient Hospital Stay: Payer: PPO | Attending: Hematology and Oncology | Admitting: Hematology and Oncology

## 2022-05-29 ENCOUNTER — Encounter: Payer: Self-pay | Admitting: Hematology and Oncology

## 2022-05-29 VITALS — BP 132/80 | HR 56 | Temp 98.0°F | Resp 18 | Ht 72.0 in | Wt 168.6 lb

## 2022-05-29 DIAGNOSIS — Z87891 Personal history of nicotine dependence: Secondary | ICD-10-CM | POA: Insufficient documentation

## 2022-05-29 DIAGNOSIS — D539 Nutritional anemia, unspecified: Secondary | ICD-10-CM

## 2022-05-29 DIAGNOSIS — I10 Essential (primary) hypertension: Secondary | ICD-10-CM | POA: Insufficient documentation

## 2022-05-29 DIAGNOSIS — D509 Iron deficiency anemia, unspecified: Secondary | ICD-10-CM | POA: Diagnosis not present

## 2022-05-29 NOTE — Progress Notes (Signed)
[place] NOTE  Patient Care Team: Rankins, Kenneth Salinas, MD as PCP - General (Family Medicine)  CHIEF COMPLAINTS/PURPOSE OF CONSULTATION:  Anemia.  ASSESSMENT & PLAN:   This is a very pleasant 77 year old male patient with past medical history significant for hypertension referred to hematology for evaluation of mild microcytic anemia.  He denies any complaints except for some weight loss in the past 6 weeks of about 10 pounds.  During his first visit, we have done some labs which did not show any evidence of anemia.  There were no major concerns hence we recommended one-time follow-up.  He is here for his 48-monthfollow-up.  Since his last visit, he has not lost any more weight.  He feels really well.  No concerns expressed today.  Physical examination quite unremarkable, healthy appearing 77year old. We have reviewed his labs from October which showed normal hemoglobin.  He has a physical coming up next week and will have repeat labs hence we have deferred labs today.  I have encouraged him to return to clinic as needed at this time.  He will upload some labs from next week for review.  HISTORY OF PRESENTING ILLNESS:  Kenneth NANDA89y.o. male is here because of anemia.  This is a very pleasant 77year old male patient with past medical history significant for hypertension referred to hematology for evaluation of mild macrocytic anemia.    Since his last visit, he continues to feel well.  He has been eating more plant-based diet, has maintained his weight.  He stays active, continues to drink 2 glasses of wine every day.  He had a colonoscopy which was unremarkable according to the patient. Rest of the pertinent 10 point ROS reviewed and negative  MEDICAL HISTORY:  Past Medical History:  Diagnosis Date   Allergy    Arthritis    Coronary artery disease    H/O hiatal hernia    Hypertension    No pertinent past medical history     SURGICAL HISTORY: Past  Surgical History:  Procedure Laterality Date   CARDIAC CATHETERIZATION  5/12   CARPAL TUNNEL RELEASE  08/13/2011   Procedure: CARPAL TUNNEL RELEASE;  Surgeon: GWynonia Sours MD;  [place] [facility]  Service: Orthopedics;  Laterality: Left;   COLONOSCOPY     FOREIGN BODY REMOVAL  08/13/2011   Procedure: FOREIGN BODY REMOVAL ADULT;  Surgeon: GWynonia Sours MD;  [place] [facility]  Service: Orthopedics;  Laterality: Left;  removal foreign body index finger metacarpal phalangeal   JOINT REPLACEMENT  2010   lt mod total knee   JOINT REPLACEMENT  2008   rt total knee   KNEE ARTHROPLASTY     rt and lt as young man   KNEE ARTHROSCOPY     right and left   ORIF FINGER FRACTURE  4/11   rt index   RESECTION DISTAL CLAVICAL Right 02/21/2017   Procedure: DISTAL CLAVICLE EXCISION;  Surgeon: MNinetta Lights MD;  [place] [facility]  Service: Orthopedics;  Laterality: Right;   SHOULDER ARTHROSCOPY WITH ROTATOR CUFF REPAIR AND SUBACROMIAL DECOMPRESSION Right 02/21/2017   Procedure: RIGHT SHOULDER ARTHROSCOPY WITH DEBRIDEMENT, ACROMIOPLASTY, ROTATOR CUFF REPAIR;  Surgeon: MNinetta Lights MD;  [place] [facility]  Service: Orthopedics;  Laterality: Right;   SHOULDER ARTHROSCOPY WITH SUBACROMIAL DECOMPRESSION, ROTATOR CUFF REPAIR AND BICEP TENDON REPAIR Left 07/11/2017   Procedure: LEFT SHOULDER ARTHROSCOPY DEBRIDEMENT WITH SUBACROMIAL DECOMPRESSION, ROTATOR CUFF REPAIR AND  BICEP TENODESIS;  Surgeon: Hiram Gash, MD;  Location: [facility];  Service: Orthopedics;  Laterality: Left;   TONSILLECTOMY     TOTAL HIP ARTHROPLASTY Right 12/12/2016   TOTAL HIP ARTHROPLASTY Right 12/12/2016   Procedure: TOTAL HIP ARTHROPLASTY ANTERIOR APPROACH;  Surgeon: [name], MD;  Location: [facility];  Service: Orthopedics;  Laterality: Right;    SOCIAL HISTORY: Social History   Socioeconomic History   Marital status: Married    Spouse  name: Not on file   Number of children: Not on file   Years of education: Not on file   Highest education level: Not on file  Occupational History   Not on file  Tobacco Use   Smoking status: Former    Types: Cigarettes    Quit date: 08/07/1971    Years since quitting: 50.8   Smokeless tobacco: Never  Vaping Use   Vaping Use: Never used  Substance and Sexual Activity   Alcohol use: Yes    Comment: daily   Drug use: No   Sexual activity: Not on file  Other Topics Concern   Not on file  Social History Narrative   Not on file   Social Determinants of Health   Financial Resource Strain: Not on file  Food Insecurity: Not on file  Transportation Needs: Not on file  Physical Activity: Not on file  Stress: Not on file  Social Connections: Not on file  Intimate Partner Violence: Not on file    FAMILY HISTORY: Family History  Problem Relation Age of Onset   Heart disease Brother    Hyperlipidemia Brother     ALLERGIES:  is allergic to bee venom and lodine [etodolac].  MEDICATIONS:  Current Outpatient Medications  Medication Sig Dispense Refill   aspirin EC 81 MG tablet Take 81 mg by mouth daily.     cholecalciferol (VITAMIN D3) 25 MCG (1000 UNIT) tablet Take 1,000 Units by mouth daily.     co-enzyme Q-10 30 MG capsule Take 30 mg by mouth daily.     cyanocobalamin (VITAMIN B12) 1000 MCG tablet Take 1,000 mcg by mouth daily.     glucosamine-chondroitin 500-400 MG tablet Take 1 tablet by mouth daily.     losartan (COZAAR) 25 MG tablet Take 25 mg by mouth daily.     sildenafil (REVATIO) 20 MG tablet Take 60 mg by mouth as needed (for ED).     No current facility-administered medications for this visit.     PHYSICAL EXAMINATION:  ECOG PERFORMANCE STATUS: 0 - Asymptomatic  Vitals:   05/29/22 0858  BP: 132/80  Pulse: (!) 56  Resp: 18  Temp: 98 F (36.7 C)  SpO2: 100%   Filed Weights   05/29/22 0858  Weight: 168 lb 9.6 oz (76.5 kg)    Physical  Exam Constitutional:      Appearance: Normal appearance.  Cardiovascular:     Rate and Rhythm: Normal rate and regular rhythm.  Pulmonary:     Effort: Pulmonary effort is normal.     Breath sounds: Normal breath sounds.  Abdominal:     General: Abdomen is flat. Bowel sounds are normal.     Palpations: Abdomen is soft.  Musculoskeletal:        General: No swelling.     Cervical back: Normal range of motion and neck supple. No rigidity.  Lymphadenopathy:     Cervical: No cervical adenopathy.  Skin:    General: Skin is warm and dry.  Neurological:  General: No focal deficit present.     Mental Status: He is alert.      LABORATORY DATA:  I have reviewed the data as listed Lab Results  Component Value Date   WBC 5.0 03/27/2022   HGB 13.9 03/27/2022   HCT 39.3 03/27/2022   MCV 92.6 03/27/2022   PLT 186 03/27/2022     Chemistry      Component Value Date/Time   NA 134 (L) 03/27/2022 1118   K 4.7 03/27/2022 1118   CL 100 03/27/2022 1118   CO2 30 03/27/2022 1118   BUN 10 03/27/2022 1118   CREATININE 0.85 03/27/2022 1118      Component Value Date/Time   CALCIUM 9.2 03/27/2022 1118   ALKPHOS 59 03/27/2022 1118   AST 19 03/27/2022 1118   ALT 11 03/27/2022 1118   BILITOT 0.6 03/27/2022 1118     We reviewed his labs from October 2023. RADIOGRAPHIC STUDIES: I have personally reviewed the radiological images as listed and agreed with the findings in the report. No results found.  All questions were answered. The patient knows to call the clinic with any problems, questions or concerns. I spent 20 minutes in the care of this patient including H and P, review of records, counseling and coordination of care.     Benay Pike, MD 05/29/2022 9:02 AM

## 2022-05-31 DIAGNOSIS — H6123 Impacted cerumen, bilateral: Secondary | ICD-10-CM | POA: Diagnosis not present

## 2022-06-05 DIAGNOSIS — D649 Anemia, unspecified: Secondary | ICD-10-CM | POA: Diagnosis not present

## 2022-06-05 DIAGNOSIS — E7800 Pure hypercholesterolemia, unspecified: Secondary | ICD-10-CM | POA: Diagnosis not present

## 2022-06-05 DIAGNOSIS — Z1331 Encounter for screening for depression: Secondary | ICD-10-CM | POA: Diagnosis not present

## 2022-06-05 DIAGNOSIS — Z79899 Other long term (current) drug therapy: Secondary | ICD-10-CM | POA: Diagnosis not present

## 2022-06-05 DIAGNOSIS — Z6822 Body mass index (BMI) 22.0-22.9, adult: Secondary | ICD-10-CM | POA: Diagnosis not present

## 2022-06-05 DIAGNOSIS — I10 Essential (primary) hypertension: Secondary | ICD-10-CM | POA: Diagnosis not present

## 2022-06-05 DIAGNOSIS — Z0000 Encounter for general adult medical examination without abnormal findings: Secondary | ICD-10-CM | POA: Diagnosis not present

## 2022-06-05 DIAGNOSIS — N529 Male erectile dysfunction, unspecified: Secondary | ICD-10-CM | POA: Diagnosis not present

## 2022-06-05 DIAGNOSIS — M1611 Unilateral primary osteoarthritis, right hip: Secondary | ICD-10-CM | POA: Diagnosis not present

## 2022-06-05 DIAGNOSIS — E291 Testicular hypofunction: Secondary | ICD-10-CM | POA: Diagnosis not present

## 2022-06-12 DIAGNOSIS — D225 Melanocytic nevi of trunk: Secondary | ICD-10-CM | POA: Diagnosis not present

## 2022-06-12 DIAGNOSIS — X32XXXD Exposure to sunlight, subsequent encounter: Secondary | ICD-10-CM | POA: Diagnosis not present

## 2022-06-12 DIAGNOSIS — L570 Actinic keratosis: Secondary | ICD-10-CM | POA: Diagnosis not present

## 2022-06-12 DIAGNOSIS — L820 Inflamed seborrheic keratosis: Secondary | ICD-10-CM | POA: Diagnosis not present

## 2022-06-12 DIAGNOSIS — Z1283 Encounter for screening for malignant neoplasm of skin: Secondary | ICD-10-CM | POA: Diagnosis not present

## 2022-06-12 DIAGNOSIS — L821 Other seborrheic keratosis: Secondary | ICD-10-CM | POA: Diagnosis not present

## 2022-06-14 ENCOUNTER — Encounter: Payer: Self-pay | Admitting: Hematology and Oncology

## 2022-06-14 NOTE — Telephone Encounter (Signed)
Called Pt regarding MyChart message. Per Benay Pike, MD, labs show major concern-only a mildly low WBC. Pt denies recent cold/infection. Pt verbalized understanding with no further questions and was appreciative of call.

## 2022-07-02 IMAGING — US US AORTA
1 series · 13 of 25 positions shown · non-contrast
Comparison: None.
COMPARISON: None.

Addendum:
CLINICAL DATA: Known abdominal aortic aneurysm.

EXAM:
ULTRASOUND OF ABDOMINAL AORTA
TECHNIQUE: Ultrasound examination of the abdominal aorta and proximal common
iliac arteries was performed to evaluate for aneurysm. Additional
color and Doppler images of the distal aorta were obtained to
document patency.

[Series 1: us aorta · 0.20mm/px · 13 of 51 slices shown]
[im 1/51]
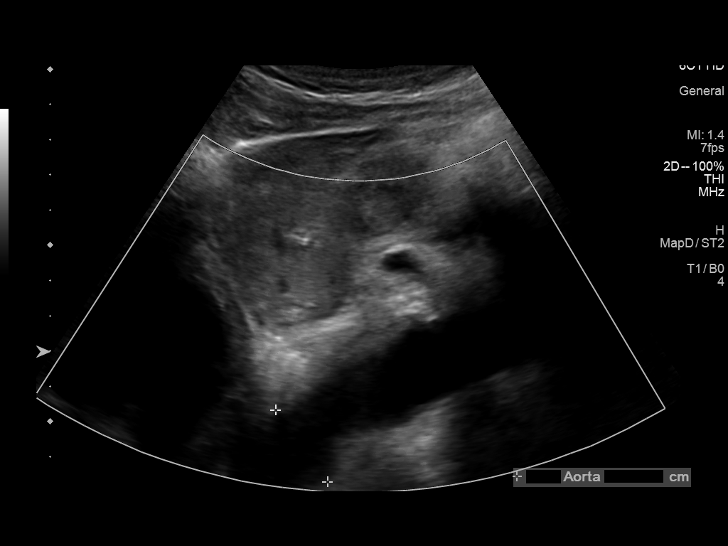
[im 5/51]
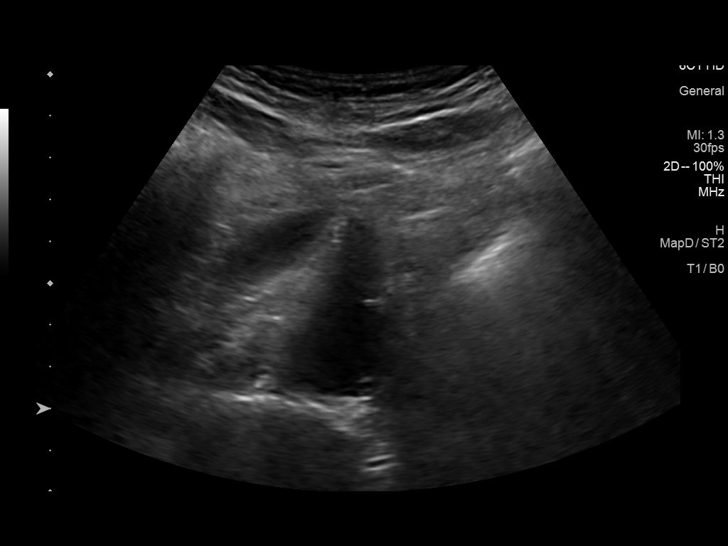
[im 9/51]
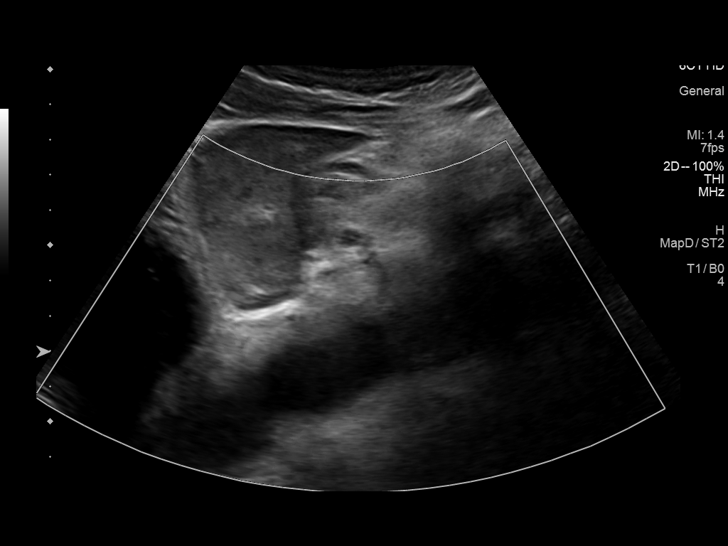
[im 13/51]
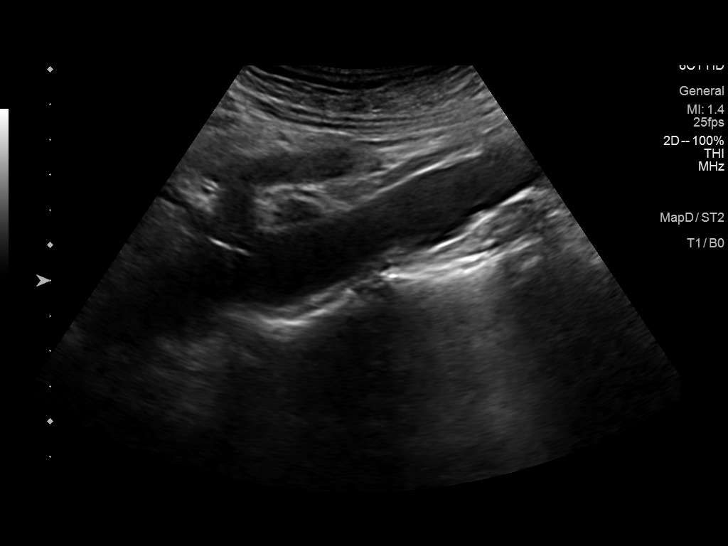
[im 17/51]
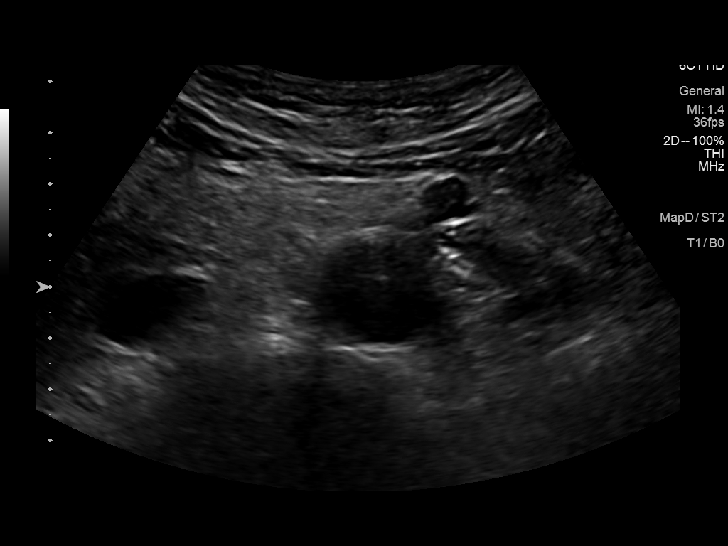
[im 21/51]
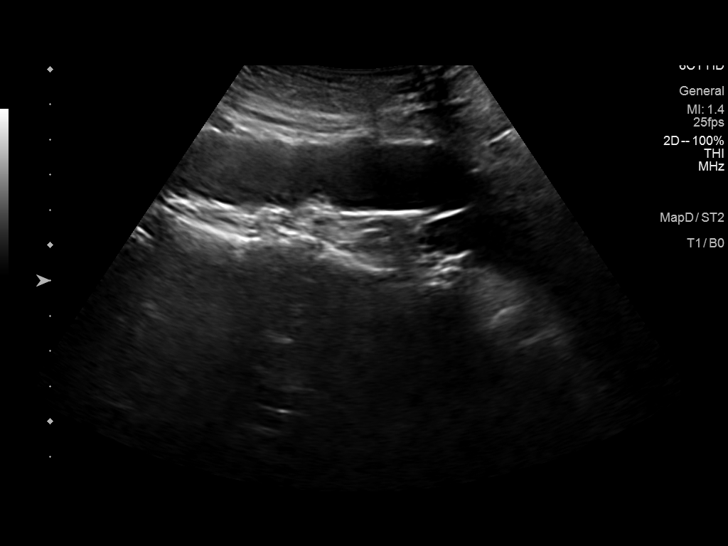
[im 26/51]
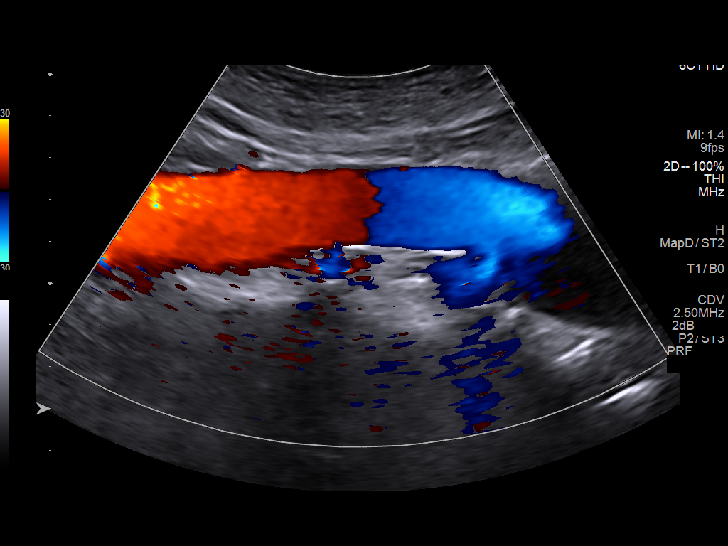
[im 30/51]
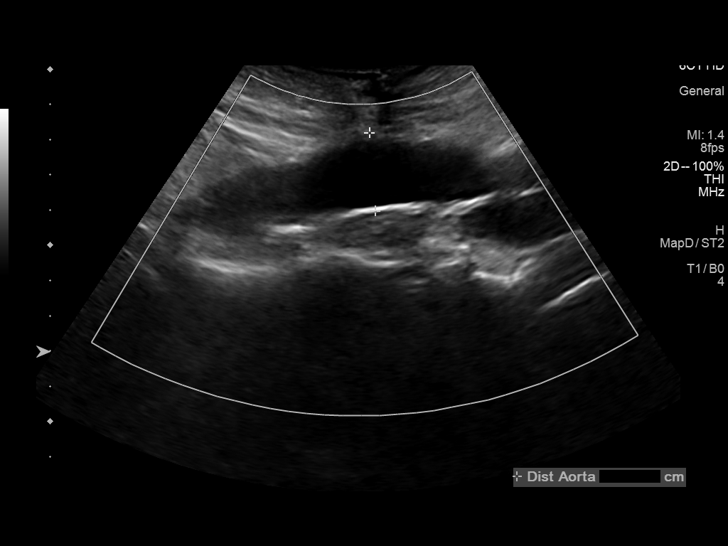
[im 34/51]
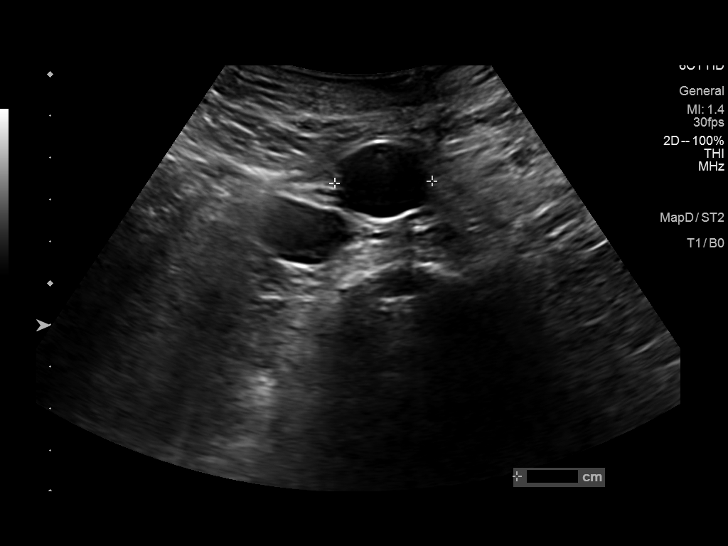
[im 38/51]
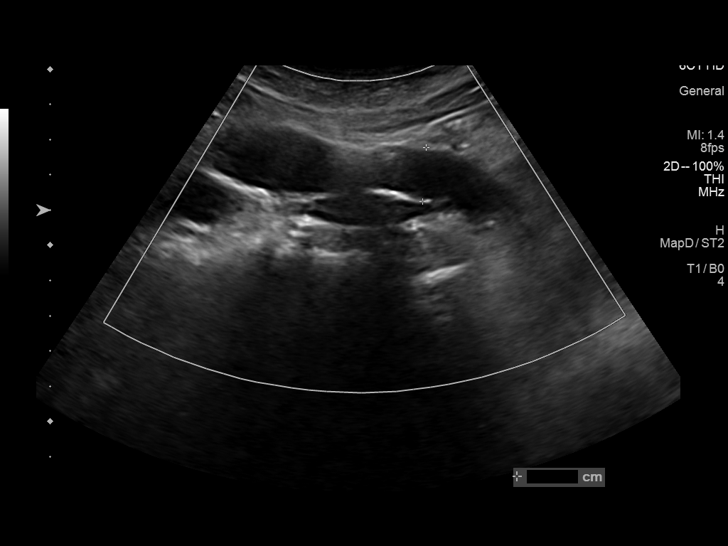
[im 42/51]
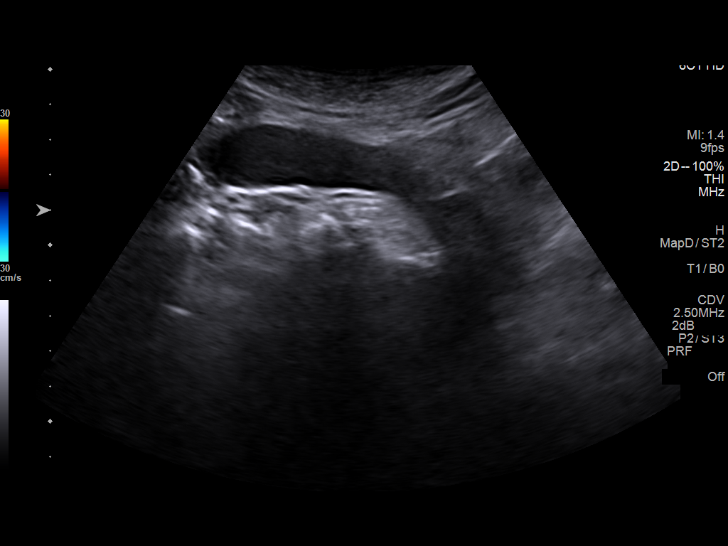
[im 46/51]
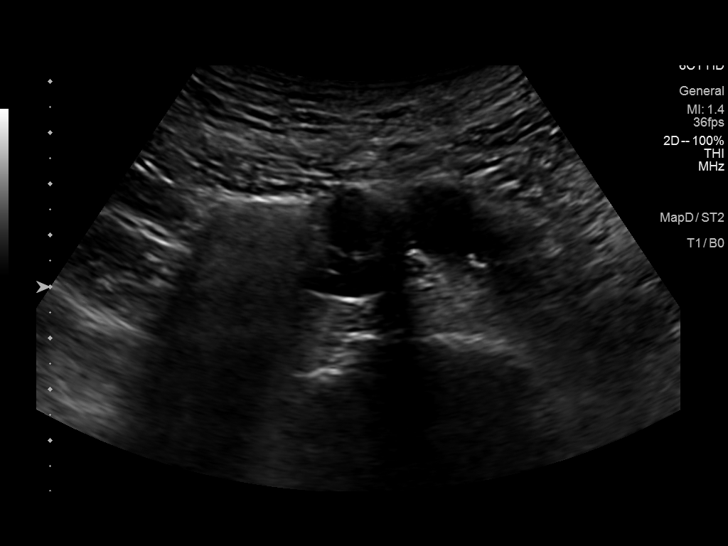
[im 51/51]
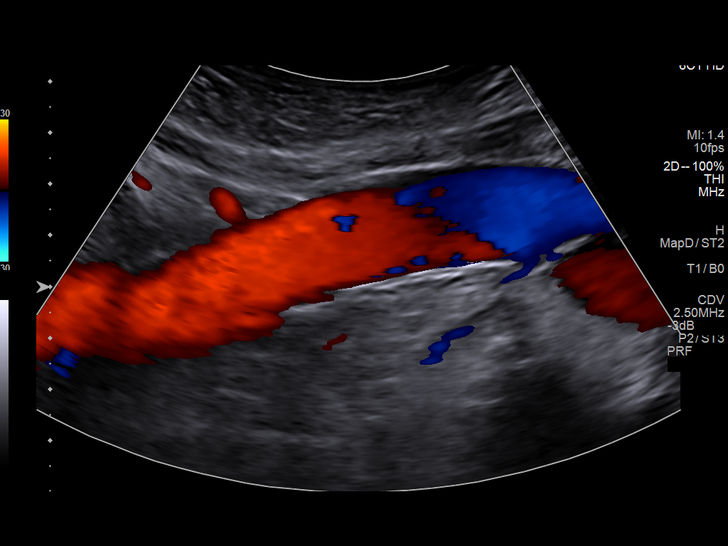

[13 of 25 positions shown; findings below may reference images not displayed]

FINDINGS: Abdominal aortic measurements as follows:

Proximal:  2.5 x 2.5 cm

Mid:  2.5 x 2.5 cm

Distal:  2.4 x 2.5 cm
Patent: Yes, peak systolic velocity is 95 cm/s

Right common iliac artery: 1.5 cm

Left common iliac artery: 1.4 cm
IMPRESSION: 2.5 cm infrarenal abdominal aortic aneurysm.

ADDENDUM:
Findings are now compared to prior examination of 04/27/2016.
Maximal diameter of the distal aorta was reported as 3.1 cm on prior
examination, however, the distal abdominal aorta is seen to better
degree on the current examination and the current maximal diameter
measurement of 2.5 cm is indeed accurate. No other changes are made
to this report. No significant change since prior examination. Given
the maximal diameter of the abdominal aorta, this is best considered
an ectatic abdominal aorta at risk for aneurysm development.
Recommend followup by ultrasound in 5 years. This recommendation
follows ACR consensus guidelines: White Paper of the ACR Incidental
Findings Committee II on Vascular Findings. [HOSPITAL] 9748;
[DATE].

Aortic aneurysm NOS (64R1P-CRE.U)

*** End of Addendum ***
FINDINGS: Abdominal aortic measurements as follows:

Proximal:  2.5 x 2.5 cm

Mid:  2.5 x 2.5 cm

Distal:  2.4 x 2.5 cm
Patent: Yes, peak systolic velocity is 95 cm/s

Right common iliac artery: 1.5 cm

Left common iliac artery: 1.4 cm
IMPRESSION: 2.5 cm infrarenal abdominal aortic aneurysm.

## 2022-07-11 ENCOUNTER — Encounter: Payer: Self-pay | Admitting: Hematology and Oncology

## 2022-07-31 DIAGNOSIS — R948 Abnormal results of function studies of other organs and systems: Secondary | ICD-10-CM | POA: Diagnosis not present

## 2022-07-31 DIAGNOSIS — E291 Testicular hypofunction: Secondary | ICD-10-CM | POA: Diagnosis not present

## 2022-08-07 DIAGNOSIS — N5201 Erectile dysfunction due to arterial insufficiency: Secondary | ICD-10-CM | POA: Diagnosis not present

## 2022-08-07 DIAGNOSIS — E291 Testicular hypofunction: Secondary | ICD-10-CM | POA: Diagnosis not present

## 2022-09-14 ENCOUNTER — Encounter (HOSPITAL_BASED_OUTPATIENT_CLINIC_OR_DEPARTMENT_OTHER): Payer: Self-pay

## 2022-09-14 ENCOUNTER — Other Ambulatory Visit: Payer: Self-pay

## 2022-09-14 ENCOUNTER — Emergency Department (HOSPITAL_BASED_OUTPATIENT_CLINIC_OR_DEPARTMENT_OTHER): Payer: PPO | Admitting: Radiology

## 2022-09-14 ENCOUNTER — Emergency Department (HOSPITAL_BASED_OUTPATIENT_CLINIC_OR_DEPARTMENT_OTHER)
Admission: EM | Admit: 2022-09-14 | Discharge: 2022-09-15 | Disposition: A | Discharge status: Home / Self Care | Payer: PPO | Attending: Emergency Medicine | Admitting: Emergency Medicine

## 2022-09-14 DIAGNOSIS — R9431 Abnormal electrocardiogram [ECG] [EKG]: Secondary | ICD-10-CM | POA: Diagnosis not present

## 2022-09-14 DIAGNOSIS — Z7982 Long term (current) use of aspirin: Secondary | ICD-10-CM | POA: Insufficient documentation

## 2022-09-14 DIAGNOSIS — I491 Atrial premature depolarization: Secondary | ICD-10-CM | POA: Diagnosis not present

## 2022-09-14 DIAGNOSIS — I700 Atherosclerosis of aorta: Secondary | ICD-10-CM | POA: Diagnosis not present

## 2022-09-14 DIAGNOSIS — R002 Palpitations: Secondary | ICD-10-CM | POA: Diagnosis not present

## 2022-09-14 LAB — CBC
HCT: 44.9 % (ref 39.0–52.0)
Hemoglobin: 16 g/dL (ref 13.0–17.0)
MCH: 33.1 pg (ref 26.0–34.0)
MCHC: 35.6 g/dL (ref 30.0–36.0)
MCV: 93 fL (ref 80.0–100.0)
Platelets: 195 10*3/uL (ref 150–400)
RBC: 4.83 MIL/uL (ref 4.22–5.81)
RDW: 13.5 % (ref 11.5–15.5)
WBC: 7.2 10*3/uL (ref 4.0–10.5)
nRBC: 0 % (ref 0.0–0.2)

## 2022-09-14 LAB — BASIC METABOLIC PANEL
Anion gap: 8 (ref 5–15)
BUN: 12 mg/dL (ref 8–23)
CO2: 25 mmol/L (ref 22–32)
Calcium: 9 mg/dL (ref 8.9–10.3)
Chloride: 100 mmol/L (ref 98–111)
Creatinine, Ser: 0.89 mg/dL (ref 0.61–1.24)
GFR, Estimated: >60 mL/min (ref >60)
Glucose, Bld: 145 mg/dL — ABNORMAL HIGH (ref 70–99)
Potassium: 4 mmol/L (ref 3.5–5.1)
Sodium: 133 mmol/L — ABNORMAL LOW (ref 135–145)

## 2022-09-14 LAB — TROPONIN I (HIGH SENSITIVITY): Troponin I (High Sensitivity): 13 ng/L (ref <18)

## 2022-09-14 NOTE — ED Triage Notes (Signed)
Sent by PCP from [facility] to [facility] for abnormal EKG.   Went in earlier for palpitations but not currently having them.

## 2022-09-15 LAB — TROPONIN I (HIGH SENSITIVITY): Troponin I (High Sensitivity): 13 ng/L (ref <18)

## 2022-09-15 NOTE — ED Provider Notes (Signed)
[facility] EMERGENC[facility] DEPARTMENT AT [facility] Provider Note   CSN: [id number] Arrival date & time: 09/14/22  2019     History  Chief Complaint  Patient presents with   Palpitations    Kenneth Wagner is a 77 y.o. male.  The history is provided by the patient and the spouse.  Patient presents with palpitations.  He reports around 1 PM on April 19 he felt an irregular heartbeat.  No chest pain or shortness of breath.  No weakness.  No syncope.  No new medications.  He does not take any beta-blockers or calcium channel blockers.  He has never had this before.  He drinks 4 cups of coffee a day  He went to an urgent care, was told he had an abnormal EKG and was told to go to the ER.  He is now symptom-free He is very active, has no symptoms with exertion.  He reports he walked a mile today    Home Medications Prior to Admission medications   Medication Sig Start Date End Date Taking? Authorizing Provider  aspirin EC 81 MG tablet Take 81 mg by mouth daily.    [provider]  atorvastatin (LIPITOR) 10 MG tablet Take 10 mg by mouth daily.    [provider]  cholecalciferol (VITAMIN D3) 25 MCG (1000 UNIT) tablet Take 1,000 Units by mouth daily.    [provider]  co-enzyme Q-10 30 MG capsule Take 30 mg by mouth daily.    [provider]  cyanocobalamin (VITAMIN B12) 1000 MCG tablet Take 1,000 mcg by mouth daily.    [provider]  glucosamine-chondroitin 500-400 MG tablet Take 2 tablets by mouth daily.    [provider]  losartan (COZAAR) 25 MG tablet Take 25 mg by mouth daily.    [provider]  sildenafil (VIAGRA) 100 MG tablet Take 100 mg by mouth daily as needed for erectile dysfunction.    [provider]  testosterone cypionate (DEPOTESTOSTERONE CYPIONATE) 200 MG/ML injection Inject 200 mg into the muscle once a week. Patient has been taking 0.05    [provider]      Allergies     Bee venom and Lodine [etodolac]    Review of Systems   Review of Systems  Constitutional:  Negative for fatigue.  Respiratory:  Negative for shortness of breath.   Cardiovascular:  Negative for chest pain.  Gastrointestinal:  Negative for diarrhea and vomiting.  Neurological:  Negative for syncope.    Physical Exam Updated Vital Signs BP 137/75   Pulse 71   Temp 98.3 F (36.8 C)   Resp 15   Ht 1.829 m (6')   Wt 77.1 kg   SpO2 93%   BMI 23.06 kg/m  Physical Exam CONSTITUTIONAL: Well developed/well nourished, appears young for age HEAD: Normocephalic/atraumatic EYES: EOMI/PERRL ENMT: Mucous membranes moist NECK: supple no meningeal signs CV: S1/S2 noted, no murmurs/rubs/gallops noted LUNGS: Lungs are clear to auscultation bilaterally, no apparent distress ABDOMEN: soft, nontender NEURO: Pt is awake/alert/appropriate, moves all extremitiesx4.  No facial droop.   EXTREMITIES: pulses normal/equal, full ROM SKIN: warm, color normal PSYCH: no abnormalities of mood noted, alert and oriented to situation  ED Results / Procedures / Treatments   Labs (all labs ordered are listed, but only abnormal results are displayed) Labs Reviewed  BASIC METABOLIC PANEL - Abnormal; Notable for the following components:      Result Value   Sodium 133 (*)    Glucose, Bld 145 (*)  All other components within normal limits  CBC  TROPONIN I (HIGH SENSITIVITY)  TROPONIN I (HIGH SENSITIVITY)    EKG EKG Interpretation  Date/Time:  Friday September 14 2022 20:40:02 EDT Ventricular Rate:  72 PR Interval:  154 QRS Duration: 86 QT Interval:  406 QTC Calculation: 444 R Axis:   79 Text Interpretation: Sinus rhythm with Premature atrial complexes with Abberant conduction Cannot rule out Anterior infarct , age undetermined Abnormal ECG Confirmed by Zadie Rhine ([id number]) on 09/15/2022 12:25:01 AM    Radiology DG Chest 2 View  Result Date: 09/14/2022 CLINICAL DATA:  Palpitations. EXAM:  CHEST - 2 VIEW COMPARISON:  December 08, 2008 FINDINGS: The heart size and mediastinal contours are within normal limits. There is mild calcification of the aortic arch and tortuosity of the descending thoracic aorta. The lungs are hyperinflated. Both lungs are clear. The visualized skeletal structures are unremarkable. IMPRESSION: No active cardiopulmonary disease. Electronically Signed   By: Aram Candela M.D.   On: 09/14/2022 20:59    Procedures Procedures    Medications Ordered in ED Medications - No data to display  ED Course/ Medical Decision Making/ A&P Clinical Course as of 09/15/22 0055  Sat Sep 15, 2022  0054 Overall patient is very well-appearing.  He is now symptom-free.  He had no weakness, no dizziness, no syncope.  No significant events on the telemetry monitor here.  He is safe for discharge home.  He will cut back on caffeine.  Referral to cardiology has been made [DW]    Clinical Course User Index [DW] Zadie Rhine, MD                             Medical Decision Making Amount and/or Complexity of Data Reviewed Labs: ordered. Radiology: ordered.   This patient presents to the ED for concern of palpitations, this involves an extensive number of treatment options, and is a complaint that carries with it a high risk of complications and morbidity.  The differential diagnosis includes but is not limited to SVT, PACs, PVCs, atrial fibrillation, atrial flutter, ventricular tachycardia, sinus tachycardia, Wolff-Parkinson-White  Comorbidities that complicate the patient evaluation: Patient's presentation is complicated by their history of hypertension  Additional history obtained: Additional history obtained from spouse  Lab Tests: I Ordered, and personally interpreted labs.  The pertinent results include: Mild hyperglycemia  Imaging Studies ordered: I ordered imaging studies including X-ray chest   I independently visualized and interpreted imaging which showed no  acute findings I agree with the radiologist interpretation  Cardiac Monitoring: The patient was maintained on a cardiac monitor.  I personally viewed and interpreted the cardiac monitor which showed an underlying rhythm of:  sinus rhythm and PACs noted   Reevaluation: After the interventions noted above, I reevaluated the patient and found that they have :improved  Complexity of problems addressed: Patient's presentation is most consistent with  acute complicated illness/injury requiring diagnostic workup  Disposition: After consideration of the diagnostic results and the patient's response to treatment,  I feel that the patent would benefit from discharge   .            Final Clinical Impression(s) / ED Diagnoses Final diagnoses:  Palpitations  PAC (premature atrial contraction)    Rx / DC Orders ED Discharge Orders          Ordered    Ambulatory referral to Cardiology        09/15/22 0054  Zadie Rhine, MD 09/15/22 [phone]

## 2022-10-01 ENCOUNTER — Ambulatory Visit (INDEPENDENT_AMBULATORY_CARE_PROVIDER_SITE_OTHER): Payer: PPO

## 2022-10-01 ENCOUNTER — Encounter: Payer: Self-pay | Admitting: Internal Medicine

## 2022-10-01 ENCOUNTER — Ambulatory Visit: Payer: PPO | Attending: Internal Medicine | Admitting: Internal Medicine

## 2022-10-01 VITALS — BP 138/68 | HR 49 | Ht 72.0 in | Wt 173.2 lb

## 2022-10-01 DIAGNOSIS — E559 Vitamin D deficiency, unspecified: Secondary | ICD-10-CM | POA: Diagnosis not present

## 2022-10-01 DIAGNOSIS — I10 Essential (primary) hypertension: Secondary | ICD-10-CM | POA: Diagnosis not present

## 2022-10-01 DIAGNOSIS — I714 Abdominal aortic aneurysm, without rupture, unspecified: Secondary | ICD-10-CM | POA: Diagnosis not present

## 2022-10-01 DIAGNOSIS — E785 Hyperlipidemia, unspecified: Secondary | ICD-10-CM | POA: Diagnosis not present

## 2022-10-01 DIAGNOSIS — M1990 Unspecified osteoarthritis, unspecified site: Secondary | ICD-10-CM | POA: Diagnosis not present

## 2022-10-01 DIAGNOSIS — R002 Palpitations: Secondary | ICD-10-CM

## 2022-10-01 DIAGNOSIS — E291 Testicular hypofunction: Secondary | ICD-10-CM | POA: Diagnosis not present

## 2022-10-01 DIAGNOSIS — Z87891 Personal history of nicotine dependence: Secondary | ICD-10-CM | POA: Diagnosis not present

## 2022-10-01 DIAGNOSIS — G629 Polyneuropathy, unspecified: Secondary | ICD-10-CM | POA: Diagnosis not present

## 2022-10-01 DIAGNOSIS — Z7982 Long term (current) use of aspirin: Secondary | ICD-10-CM | POA: Diagnosis not present

## 2022-10-01 DIAGNOSIS — N529 Male erectile dysfunction, unspecified: Secondary | ICD-10-CM | POA: Diagnosis not present

## 2022-10-01 DIAGNOSIS — E538 Deficiency of other specified B group vitamins: Secondary | ICD-10-CM | POA: Diagnosis not present

## 2022-10-01 NOTE — Progress Notes (Unsigned)
Enrolled for [employer] to mail a ZIO XT long term holter monitor to the patients address on file.  

## 2022-10-01 NOTE — Patient Instructions (Signed)
Medication Instructions:  No Changes In Medications at this time.  *If you need a refill on your cardiac medications before your next appointment, please call your pharmacy*  Lab Work: None Ordered At This Time.  If you have labs (blood work) drawn today and your tests are completely normal, you will receive your results only by: MyChart Message (if you have MyChart) OR A paper copy in the mail If you have any lab test that is abnormal or we need to change your treatment, we will call you to review the results.  Testing/Procedures:  ZIO XT- Long Term Monitor Instructions   Your physician has requested you wear your ZIO patch monitor___7____days.   This is a single patch monitor.  [employer] supplies one patch monitor per enrollment.  Additional stickers are not available.   Please do not apply patch if you will be having a Nuclear Stress Test, Echocardiogram, Cardiac CT, MRI, or Chest Xray during the time frame you would be wearing the monitor. The patch cannot be worn during these tests.  You cannot remove and re-apply the ZIO XT patch monitor.   Your ZIO patch monitor will be sent USPS Priority mail from [employer] directly to your home address. The monitor may also be mailed to a PO BOX if home delivery is not available.   It may take 3-5 days to receive your monitor after you have been enrolled.   Once you have received you monitor, please review enclosed instructions.  Your monitor has already been registered assigning a specific monitor serial # to you.   Applying the monitor   Shave hair from upper left chest.   Hold abrader disc by orange tab.  Rub abrader in 40 strokes over left upper chest as indicated in your monitor instructions.   Clean area with 4 enclosed alcohol pads .  Use all pads to assure are is cleaned thoroughly.  Let dry.   Apply patch as indicated in monitor instructions.  Patch will be place under collarbone on left side of chest with arrow pointing  upward.   Rub patch adhesive wings for 2 minutes.Remove white label marked "1".  Remove white label marked "2".  Rub patch adhesive wings for 2 additional minutes.   While looking in a mirror, press and release button in center of patch.  A small green light will flash 3-4 times .  This will be your only indicator the monitor has been turned on.     Do not shower for the first 24 hours.  You may shower after the first 24 hours.   Press button if you feel a symptom. You will hear a small click.  Record Date, Time and Symptom in the Patient Log Book.   When you are ready to remove patch, follow instructions on last 2 pages of Patient Log Book.  Stick patch monitor onto last page of Patient Log Book.   Place Patient Log Book in Blue box.  Use locking tab on box and tape box closed securely.  The Orange and White box has prepaid postage on it.  Please place in mailbox as soon as possible.  Your physician should have your test results approximately 7 days after the monitor has been mailed back to [employer].   Call [employer] Customer Care at [phone] if you have questions regarding your ZIO XT patch monitor.  Call them immediately if you see an orange light blinking on your monitor.   If your monitor falls off in less   than 4 days contact our Monitor department at [phone].  If your monitor becomes loose or falls off after 4 days call [employer] at [phone] for suggestions on securing your monitor.   Follow-Up: At [facility], you and your health needs are our priority.  As part of our continuing mission to provide you with exceptional heart care, we have created designated Provider Care Teams.  These Care Teams include your primary Cardiologist (physician) and Advanced Practice Providers (APPs -  Physician Assistants and Nurse Practitioners) who all work together to provide you with the care you need, when you need it.  Your next appointment:   AS NEEDED PENDING  RESULTS   Provider:   Maisie Fus, MD

## 2022-10-01 NOTE — Progress Notes (Signed)
Cardiology Office Note:    Date:  10/01/2022   ID:  Kenneth Wagner, DOB Sep 06, 1945, MRN [id number]  PCP:  Kenneth Heron, MD   [facility] Providers Cardiologist:  Kenneth Fus, MD     Referring MD: Kenneth Rhine, MD   No chief complaint on file. Palpitations  History of Present Illness:    Kenneth CARLE is a 77 y.o. male with a hx of HTN, arthritis referral for palpitations   He states he felt an irregular heart beat.  He feels it was racing.  He noticed it the whole day. Per their notes He reports around 1 PM on April 19 he felt an irregular heartbeat. No chest pain or shortness of breath. No weakness. No syncope. No new medications. He does not take any beta-blockers or calcium channel blockers. He has never had this before. He drinks 4 cups of coffee a day. EKG 09/14/2022- sinus rhythm, abberant PACs. He went to urgent care initially and was told to go to the ED for his EKG. Prior TSH was normal. Troponin negative.  Had a cath 25 years ago for an irregular stress. No PCI.   He is very active. He walks. He denies CP or SOB. No hx syncope.  Father died of an MI. Brother MI. No premature CAD  Past Medical History:  Diagnosis Date   Allergy    Arthritis    Coronary artery disease    H/O hiatal hernia    Hypertension    No pertinent past medical history     Past Surgical History:  Procedure Laterality Date   CARDIAC CATHETERIZATION  09/2010   CARPAL TUNNEL RELEASE  08/13/2011   Procedure: CARPAL TUNNEL RELEASE;  Surgeon: Nicki Reaper, MD;  Location: [facility];  Service: Orthopedics;  Laterality: Left;   COLONOSCOPY     FOREIGN BODY REMOVAL  08/13/2011   Procedure: FOREIGN BODY REMOVAL ADULT;  Surgeon: Nicki Reaper, MD;  Location: [facility];  Service: Orthopedics;  Laterality: Left;  removal foreign body index finger metacarpal phalangeal   JOINT REPLACEMENT  2010   lt mod total knee   JOINT REPLACEMENT  2008    rt total knee   KNEE ARTHROPLASTY     rt and lt as young man   KNEE ARTHROSCOPY     right and left   ORIF FINGER FRACTURE  08/2009   rt index   RESECTION DISTAL CLAVICAL Right 02/21/2017   Procedure: DISTAL CLAVICLE EXCISION;  Surgeon: Loreta Ave, MD;  Location: [facility];  Service: Orthopedics;  Laterality: Right;   SHOULDER ARTHROSCOPY WITH ROTATOR CUFF REPAIR AND SUBACROMIAL DECOMPRESSION Right 02/21/2017   Procedure: RIGHT SHOULDER ARTHROSCOPY WITH DEBRIDEMENT, ACROMIOPLASTY, ROTATOR CUFF REPAIR;  Surgeon: Loreta Ave, MD;  Location: [facility];  Service: Orthopedics;  Laterality: Right;   SHOULDER ARTHROSCOPY WITH SUBACROMIAL DECOMPRESSION, ROTATOR CUFF REPAIR AND BICEP TENDON REPAIR Left 07/11/2017   Procedure: LEFT SHOULDER ARTHROSCOPY DEBRIDEMENT WITH SUBACROMIAL DECOMPRESSION, ROTATOR CUFF REPAIR AND BICEP TENODESIS;  Surgeon: Bjorn Pippin, MD;  Location: [facility];  Service: Orthopedics;  Laterality: Left;   TONSILLECTOMY     TOTAL HIP ARTHROPLASTY Right 12/12/2016   TOTAL HIP ARTHROPLASTY Right 12/12/2016   Procedure: TOTAL HIP ARTHROPLASTY ANTERIOR APPROACH;  Surgeon: Loreta Ave, MD;  Location: [facility];  Service: Orthopedics;  Laterality: Right;    Current Medications: Current Meds  Medication Sig   aspirin EC 81  MG tablet Take 81 mg by mouth daily.   atorvastatin (LIPITOR) 10 MG tablet Take 10 mg by mouth daily.   cholecalciferol (VITAMIN D3) 25 MCG (1000 UNIT) tablet Take 1,000 Units by mouth daily.   co-enzyme Q-10 30 MG capsule Take 30 mg by mouth daily.   cyanocobalamin (VITAMIN B12) 1000 MCG tablet Take 1,000 mcg by mouth daily.   EPINEPHrine 0.3 mg/0.3 mL IJ SOAJ injection Inject 0.3 mg into the muscle as needed for anaphylaxis.   glucosamine-chondroitin 500-400 MG tablet Take 2 tablets by mouth daily.   sildenafil (VIAGRA) 100 MG tablet Take 100 mg by mouth daily as needed for erectile dysfunction.    testosterone cypionate (DEPOTESTOSTERONE CYPIONATE) 200 MG/ML injection Inject 200 mg into the muscle once a week. Patient has been taking 0.05     Allergies:   Bee venom, Poison ivy extract, and Lodine [etodolac]   Social History   Socioeconomic History   Marital status: Married    Spouse name: Not on file   Number of children: Not on file   Years of education: Not on file   Highest education level: Not on file  Occupational History   Not on file  Tobacco Use   Smoking status: Former    Packs/day: 1.00    Years: 5.00    Additional pack years: 0.00    Total pack years: 5.00    Types: Cigarettes    Quit date: 08/07/1971    Years since quitting: 51.1   Smokeless tobacco: Never  Vaping Use   Vaping Use: Never used  Substance and Sexual Activity   Alcohol use: Yes    Alcohol/week: 10.0 standard drinks of alcohol    Types: 10 Glasses of wine per week    Comment: daily   Drug use: No   Sexual activity: Yes    Birth control/protection: None  Other Topics Concern   Not on file  Social History Narrative   Not on file   Social Determinants of Health   Financial Resource Strain: Not on file  Food Insecurity: Not on file  Transportation Needs: Not on file  Physical Activity: Not on file  Stress: Not on file  Social Connections: Not on file     Family History: The patient's family history includes Heart attack in his father; Heart disease in his brother; Hyperlipidemia in his brother.  ROS:   Please see the history of present illness.     All other systems reviewed and are negative.  EKGs/Labs/Other Studies Reviewed:    The following studies were reviewed today:   EKG:  EKG is  ordered today.  The ekg ordered today demonstrates   10/01/2022- sinus bradycardic, sinus arrhythmia  Recent Labs: 03/27/2022: ALT 11; TSH 2.572 09/14/2022: BUN 12; Creatinine, Ser 0.89; Hemoglobin 16.0; Platelets 195; Potassium 4.0; Sodium 133   Recent Lipid Panel No results found for:  "CHOL", "TRIG", "HDL", "CHOLHDL", "VLDL", "LDLCALC", "LDLDIRECT"   Risk Assessment/Calculations:                Physical Exam:    VS:  BP 138/68 (BP Location: Left Arm, Patient Position: Sitting, Cuff Size: Normal)   Pulse (!) 49   Ht 6' (1.829 m)   Wt 173 lb 3.2 oz (78.6 kg)   SpO2 99%   BMI 23.49 kg/m     Wt Readings from Last 3 Encounters:  10/01/22 173 lb 3.2 oz (78.6 kg)  09/14/22 170 lb (77.1 kg)  05/29/22 168 lb 9.6 oz (76.5 kg)  GEN:  Well nourished, well developed in no acute distress HEENT: Normal NECK: No JVD; No carotid bruits CARDIAC: RRR, no murmurs, rubs, gallops RESPIRATORY:  Clear to auscultation without rales, wheezing or rhonchi  ABDOMEN: Soft, non-tender, non-distended MUSCULOSKELETAL:  No edema; No deformity  SKIN: Warm and dry NEUROLOGIC:  Alert and oriented x 3 PSYCHIATRIC:  Normal affect   ASSESSMENT:   Palpitations: He does not have high risk features including syncope c/f arrhythmia , family hx of SCD, or abnormalities on his EKGs.  We discussed cutting back on caffeine. Will get a 7 day ziopatch PLAN:    In order of problems listed above:  7 day ziopatch Follow up PRN      Medication Adjustments/Labs and Tests Ordered: Current medicines are reviewed at length with the patient today.  Concerns regarding medicines are outlined above.  Orders Placed This Encounter  Procedures   LONG TERM MONITOR (3-14 DAYS)   EKG 12-Lead   No orders of the defined types were placed in this encounter.   Patient Instructions  Medication Instructions:  No Changes In Medications at this time.  *If you need a refill on your cardiac medications before your next appointment, please call your pharmacy*  Lab Work: None Ordered At This Time.  If you have labs (blood work) drawn today and your tests are completely normal, you will receive your results only by: MyChart Message (if you have MyChart) OR A paper copy in the mail If you have any lab test  that is abnormal or we need to change your treatment, we will call you to review the results.  Testing/Procedures:  Christena Deem- Long Term Monitor Instructions   Your physician has requested you wear your ZIO patch monitor___7____days.   This is a single patch monitor.  [employer] supplies one patch monitor per enrollment.  Additional stickers are not available.   Please do not apply patch if you will be having a Nuclear Stress Test, Echocardiogram, Cardiac CT, MRI, or Chest Xray during the time frame you would be wearing the monitor. The patch cannot be worn during these tests.  You cannot remove and re-apply the ZIO XT patch monitor.   Your ZIO patch monitor will be sent USPS Priority mail from [facility] directly to your home address. The monitor may also be mailed to a PO BOX if home delivery is not available.   It may take 3-5 days to receive your monitor after you have been enrolled.   Once you have received you monitor, please review enclosed instructions.  Your monitor has already been registered assigning a specific monitor serial # to you.   Applying the monitor   Shave hair from upper left chest.   Hold abrader disc by orange tab.  Rub abrader in 40 strokes over left upper chest as indicated in your monitor instructions.   Clean area with 4 enclosed alcohol pads .  Use all pads to assure are is cleaned thoroughly.  Let dry.   Apply patch as indicated in monitor instructions.  Patch will be place under collarbone on left side of chest with arrow pointing upward.   Rub patch adhesive wings for 2 minutes.Remove white label marked "1".  Remove white label marked "2".  Rub patch adhesive wings for 2 additional minutes.   While looking in a mirror, press and release button in center of patch.  A small green light will flash 3-4 times .  This will be your only indicator the monitor has been  turned on.     Do not shower for the first 24 hours.  You may shower after the first 24  hours.   Press button if you feel a symptom. You will hear a small click.  Record Date, Time and Symptom in the Patient Log Book.   When you are ready to remove patch, follow instructions on last 2 pages of Patient Log Book.  Stick patch monitor onto last page of Patient Log Book.   Place Patient Log Book in [place] box.  Use locking tab on box and tape box closed securely.  The [employer] and [employer] has [employer] on it.  Please place in mailbox as soon as possible.  Your physician should have your test results approximately 7 days after the monitor has been mailed back to [facility].   Call [facility] Customer Care at [phone] if you have questions regarding your ZIO XT patch monitor.  Call them immediately if you see an orange light blinking on your monitor.   If your monitor falls off in less than 4 days contact our Monitor department at [phone].  If your monitor becomes loose or falls off after 4 days call [employer] at [phone] for suggestions on securing your monitor.   Follow-Up: At [facility], you and your health needs are our priority.  As part of our continuing mission to provide you with exceptional heart care, we have created designated Provider Care Teams.  These Care Teams include your primary Cardiologist (physician) and Advanced Practice Providers (APPs -  Physician Assistants and Nurse Practitioners) who all work together to provide you with the care you need, when you need it.  Your next appointment:   AS NEEDED PENDING RESULTS   Provider:   Maisie Fus, MD      Signed, Kenneth Fus, MD  10/01/2022 4:32 PM    [facility]

## 2022-10-05 DIAGNOSIS — R002 Palpitations: Secondary | ICD-10-CM | POA: Diagnosis not present

## 2022-10-17 DIAGNOSIS — R002 Palpitations: Secondary | ICD-10-CM | POA: Diagnosis not present

## 2022-10-23 ENCOUNTER — Encounter: Payer: Self-pay | Admitting: Internal Medicine

## 2022-11-13 DIAGNOSIS — Z79899 Other long term (current) drug therapy: Secondary | ICD-10-CM | POA: Diagnosis not present

## 2022-11-13 DIAGNOSIS — E7800 Pure hypercholesterolemia, unspecified: Secondary | ICD-10-CM | POA: Diagnosis not present

## 2022-11-13 DIAGNOSIS — I7143 Infrarenal abdominal aortic aneurysm, without rupture: Secondary | ICD-10-CM | POA: Diagnosis not present

## 2022-11-13 DIAGNOSIS — Z6823 Body mass index (BMI) 23.0-23.9, adult: Secondary | ICD-10-CM | POA: Diagnosis not present

## 2022-11-13 DIAGNOSIS — I10 Essential (primary) hypertension: Secondary | ICD-10-CM | POA: Diagnosis not present

## 2022-11-26 ENCOUNTER — Other Ambulatory Visit: Payer: Self-pay | Admitting: Family Medicine

## 2022-11-26 DIAGNOSIS — I7143 Infrarenal abdominal aortic aneurysm, without rupture: Secondary | ICD-10-CM

## 2022-12-31 DIAGNOSIS — R948 Abnormal results of function studies of other organs and systems: Secondary | ICD-10-CM | POA: Diagnosis not present

## 2022-12-31 DIAGNOSIS — E291 Testicular hypofunction: Secondary | ICD-10-CM | POA: Diagnosis not present

## 2023-01-01 ENCOUNTER — Ambulatory Visit
Admission: RE | Admit: 2023-01-01 | Discharge: 2023-01-01 | Disposition: A | Discharge status: Home / Self Care | Payer: PPO | Source: Ambulatory Visit | Attending: Family Medicine | Admitting: Family Medicine

## 2023-01-01 DIAGNOSIS — D225 Melanocytic nevi of trunk: Secondary | ICD-10-CM | POA: Diagnosis not present

## 2023-01-01 DIAGNOSIS — M1611 Unilateral primary osteoarthritis, right hip: Secondary | ICD-10-CM | POA: Diagnosis not present

## 2023-01-01 DIAGNOSIS — I7143 Infrarenal abdominal aortic aneurysm, without rupture: Secondary | ICD-10-CM

## 2023-01-01 DIAGNOSIS — L570 Actinic keratosis: Secondary | ICD-10-CM | POA: Diagnosis not present

## 2023-01-01 DIAGNOSIS — Z1283 Encounter for screening for malignant neoplasm of skin: Secondary | ICD-10-CM | POA: Diagnosis not present

## 2023-01-01 DIAGNOSIS — M7541 Impingement syndrome of right shoulder: Secondary | ICD-10-CM | POA: Diagnosis not present

## 2023-01-01 DIAGNOSIS — M7542 Impingement syndrome of left shoulder: Secondary | ICD-10-CM | POA: Diagnosis not present

## 2023-01-07 DIAGNOSIS — E291 Testicular hypofunction: Secondary | ICD-10-CM | POA: Diagnosis not present

## 2023-01-07 DIAGNOSIS — N5201 Erectile dysfunction due to arterial insufficiency: Secondary | ICD-10-CM | POA: Diagnosis not present

## 2023-04-03 DIAGNOSIS — R948 Abnormal results of function studies of other organs and systems: Secondary | ICD-10-CM | POA: Diagnosis not present

## 2023-04-03 DIAGNOSIS — E291 Testicular hypofunction: Secondary | ICD-10-CM | POA: Diagnosis not present

## 2023-04-05 ENCOUNTER — Other Ambulatory Visit: Payer: Self-pay | Admitting: Medical Genetics

## 2023-04-05 DIAGNOSIS — Z006 Encounter for examination for normal comparison and control in clinical research program: Secondary | ICD-10-CM

## 2023-04-10 DIAGNOSIS — N5201 Erectile dysfunction due to arterial insufficiency: Secondary | ICD-10-CM | POA: Diagnosis not present

## 2023-04-10 DIAGNOSIS — E291 Testicular hypofunction: Secondary | ICD-10-CM | POA: Diagnosis not present

## 2023-05-02 ENCOUNTER — Other Ambulatory Visit (HOSPITAL_COMMUNITY)
Admission: RE | Admit: 2023-05-02 | Discharge: 2023-05-02 | Disposition: A | Discharge status: Home / Self Care | Payer: PPO | Source: Ambulatory Visit | Attending: Medical Genetics | Admitting: Medical Genetics

## 2023-05-02 DIAGNOSIS — Z006 Encounter for examination for normal comparison and control in clinical research program: Secondary | ICD-10-CM | POA: Insufficient documentation

## 2023-05-08 DIAGNOSIS — H6123 Impacted cerumen, bilateral: Secondary | ICD-10-CM | POA: Diagnosis not present

## 2023-05-14 LAB — GENECONNECT MOLECULAR SCREEN: Genetic Analysis Overall Interpretation: NEGATIVE

## 2023-07-29 DIAGNOSIS — M7541 Impingement syndrome of right shoulder: Secondary | ICD-10-CM | POA: Diagnosis not present

## 2023-07-31 ENCOUNTER — Emergency Department (HOSPITAL_COMMUNITY)
Admission: EM | Admit: 2023-07-31 | Discharge: 2023-07-31 | Disposition: A | Discharge status: Home / Self Care | Attending: Emergency Medicine | Admitting: Emergency Medicine

## 2023-07-31 ENCOUNTER — Encounter (HOSPITAL_COMMUNITY): Payer: Self-pay

## 2023-07-31 ENCOUNTER — Other Ambulatory Visit: Payer: Self-pay

## 2023-07-31 DIAGNOSIS — I4892 Unspecified atrial flutter: Secondary | ICD-10-CM | POA: Insufficient documentation

## 2023-07-31 DIAGNOSIS — R002 Palpitations: Secondary | ICD-10-CM

## 2023-07-31 DIAGNOSIS — Z87891 Personal history of nicotine dependence: Secondary | ICD-10-CM | POA: Insufficient documentation

## 2023-07-31 DIAGNOSIS — I2510 Atherosclerotic heart disease of native coronary artery without angina pectoris: Secondary | ICD-10-CM | POA: Diagnosis not present

## 2023-07-31 DIAGNOSIS — Z7901 Long term (current) use of anticoagulants: Secondary | ICD-10-CM | POA: Diagnosis not present

## 2023-07-31 DIAGNOSIS — I10 Essential (primary) hypertension: Secondary | ICD-10-CM | POA: Insufficient documentation

## 2023-07-31 DIAGNOSIS — Z96651 Presence of right artificial knee joint: Secondary | ICD-10-CM | POA: Insufficient documentation

## 2023-07-31 DIAGNOSIS — I499 Cardiac arrhythmia, unspecified: Secondary | ICD-10-CM | POA: Diagnosis not present

## 2023-07-31 DIAGNOSIS — I959 Hypotension, unspecified: Secondary | ICD-10-CM | POA: Diagnosis not present

## 2023-07-31 DIAGNOSIS — Z79899 Other long term (current) drug therapy: Secondary | ICD-10-CM | POA: Insufficient documentation

## 2023-07-31 DIAGNOSIS — R251 Tremor, unspecified: Secondary | ICD-10-CM | POA: Diagnosis not present

## 2023-07-31 DIAGNOSIS — R42 Dizziness and giddiness: Secondary | ICD-10-CM | POA: Diagnosis not present

## 2023-07-31 DIAGNOSIS — R000 Tachycardia, unspecified: Secondary | ICD-10-CM | POA: Diagnosis not present

## 2023-07-31 LAB — BASIC METABOLIC PANEL
Anion gap: 10 (ref 5–15)
BUN: 15 mg/dL (ref 8–23)
CO2: 23 mmol/L (ref 22–32)
Calcium: 8.2 mg/dL — ABNORMAL LOW (ref 8.9–10.3)
Chloride: 97 mmol/L — ABNORMAL LOW (ref 98–111)
Creatinine, Ser: 1.12 mg/dL (ref 0.61–1.24)
GFR, Estimated: >60 mL/min (ref >60)
Glucose, Bld: 108 mg/dL — ABNORMAL HIGH (ref 70–99)
Potassium: 3.8 mmol/L (ref 3.5–5.1)
Sodium: 130 mmol/L — ABNORMAL LOW (ref 135–145)

## 2023-07-31 LAB — CBC WITH DIFFERENTIAL/PLATELET
Abs Immature Granulocytes: 0.01 10*3/uL (ref 0.00–0.07)
Basophils Absolute: 0.1 10*3/uL (ref 0.0–0.1)
Basophils Relative: 1 %
Eosinophils Absolute: 0.1 10*3/uL (ref 0.0–0.5)
Eosinophils Relative: 2 %
HCT: 40.1 % (ref 39.0–52.0)
Hemoglobin: 14.1 g/dL (ref 13.0–17.0)
Immature Granulocytes: 0 %
Lymphocytes Relative: 27 %
Lymphs Abs: 1.2 10*3/uL (ref 0.7–4.0)
MCH: 33.5 pg (ref 26.0–34.0)
MCHC: 35.2 g/dL (ref 30.0–36.0)
MCV: 95.2 fL (ref 80.0–100.0)
Monocytes Absolute: 1.1 10*3/uL — ABNORMAL HIGH (ref 0.1–1.0)
Monocytes Relative: 25 %
Neutro Abs: 1.9 10*3/uL (ref 1.7–7.7)
Neutrophils Relative %: 45 %
Platelets: 173 10*3/uL (ref 150–400)
RBC: 4.21 MIL/uL — ABNORMAL LOW (ref 4.22–5.81)
RDW: 15.3 % (ref 11.5–15.5)
WBC: 4.3 10*3/uL (ref 4.0–10.5)
nRBC: 0 % (ref 0.0–0.2)

## 2023-07-31 LAB — MAGNESIUM: Magnesium: 2 mg/dL (ref 1.7–2.4)

## 2023-07-31 MED ORDER — APIXABAN 5 MG PO TABS
5.0000 mg | ORAL_TABLET | Freq: Two times a day (BID) | ORAL | 0 refills | Status: DC
Start: 2023-07-31 — End: 2023-08-27

## 2023-07-31 MED ORDER — SODIUM CHLORIDE 0.9 % IV BOLUS
1000.0000 mL | Freq: Once | INTRAVENOUS | Status: AC
  Administered 2023-07-31: 1000 mL via INTRAVENOUS

## 2023-07-31 MED ORDER — ETOMIDATE 2 MG/ML IV SOLN
INTRAVENOUS | Status: AC | PRN
  Administered 2023-07-31: 10 mg via INTRAVENOUS

## 2023-07-31 MED ORDER — ONDANSETRON HCL 4 MG/2ML IJ SOLN
4.0000 mg | Freq: Once | INTRAMUSCULAR | Status: AC
  Administered 2023-07-31: 4 mg via INTRAVENOUS
  Filled 2023-07-31: qty 2

## 2023-07-31 MED ORDER — APIXABAN 5 MG PO TABS
5.0000 mg | ORAL_TABLET | Freq: Once | ORAL | Status: AC
  Administered 2023-07-31: 5 mg via ORAL
  Filled 2023-07-31: qty 1

## 2023-07-31 MED ORDER — ETOMIDATE 2 MG/ML IV SOLN
10.0000 mg | Freq: Once | INTRAVENOUS | Status: DC
  Filled 2023-07-31: qty 10

## 2023-07-31 MED ORDER — METOPROLOL TARTRATE 25 MG PO TABS: 25.0000 mg | ORAL_TABLET | Freq: Three times a day (TID) | ORAL | 0 refills | Status: DC | PRN

## 2023-07-31 NOTE — Sedation Documentation (Signed)
 Synchronized Cardioversion 150j delivered by Dr. Wallace Cullens. Cardiac rhythm now in NSR with a rate 88

## 2023-07-31 NOTE — ED Provider Notes (Signed)
 [place] EMERGENCY DEPARTMENT AT [facility] Provider Note  CSN: [id number] Arrival date & time: 07/31/23 1723  Chief Complaint(s) SVT  HPI Kenneth Wagner is a 78 y.o. male with past medical history as below, significant for htn, arthritis, hiatal hernia, AAA who presents to the ED with complaint of palpitations.   Patient reports woke up this morning around 6 AM with palpitations, felt like his heart was racing, heartbeat felt irregular.  Went to bed last night feeling normal.  History of similar symptoms in the past many years ago that resolved spontaneously.  Went to urgent care, heart rate was in the 160s, EMS arrival to [facility] he got adenosine x 3 without conversion back to sinus rhythm but appears to have revealed underlying atrial flutter on provided EMS rhythm strip.  Seen in the ER 4/24 with palpitations, workup at that time revealing for PVCs, he was discharged in stable condition follow-up with cardiology Dr. Wyline Mood.  Patient reports he had cardiac monitoring that was reassuring  Past Medical History Past Medical History:  Diagnosis Date   Allergy    Arthritis    Coronary artery disease    H/O hiatal hernia    Hypertension    No pertinent past medical history    Patient Active Problem List   Diagnosis Date Noted   Primary localized osteoarthritis of right hip 12/12/2016   Impingement syndrome of right shoulder 07/31/2016   Impingement syndrome of shoulder, left 07/31/2016   Home Medication(s) Prior to Admission medications   Medication Sig Start Date End Date Taking? Authorizing Provider  apixaban (ELIQUIS) 5 MG TABS tablet Take 1 tablet (5 mg total) by mouth 2 (two) times daily. 07/31/23 08/30/23 Yes Tanda Rockers A, DO  metoprolol tartrate (LOPRESSOR) 25 MG tablet Take 1 tablet (25 mg total) by mouth 3 (three) times daily as needed (HR > 120). 07/31/23  Yes Tanda Rockers A, DO  atorvastatin (LIPITOR) 10 MG tablet Take 10 mg by mouth daily.    [provider]  cholecalciferol (VITAMIN D3) 25 MCG (1000 UNIT) tablet Take 1,000 Units by mouth daily.    [provider]  co-enzyme Q-10 30 MG capsule Take 30 mg by mouth daily.    [provider]  cyanocobalamin (VITAMIN B12) 1000 MCG tablet Take 1,000 mcg by mouth daily.    [provider]  EPINEPHrine 0.3 mg/0.3 mL IJ SOAJ injection Inject 0.3 mg into the muscle as needed for anaphylaxis. 12/27/1991   [provider]  glucosamine-chondroitin 500-400 MG tablet Take 2 tablets by mouth daily.    [provider]  losartan (COZAAR) 25 MG tablet Take 25 mg by mouth daily.    [provider]  sildenafil (VIAGRA) 100 MG tablet Take 100 mg by mouth daily as needed for erectile dysfunction.    [provider]  testosterone cypionate (DEPOTESTOSTERONE CYPIONATE) 200 MG/ML injection Inject 200 mg into the muscle once a week. Patient has been taking 0.05    [provider]  Past Surgical History Past Surgical History:  Procedure Laterality Date   CARDIAC CATHETERIZATION  09/2010   CARPAL TUNNEL RELEASE  08/13/2011   Procedure: CARPAL TUNNEL RELEASE;  Surgeon: Nicki Reaper, MD;  Location: [facility];  Service: Orthopedics;  Laterality: Left;   COLONOSCOPY     FOREIGN BODY REMOVAL  08/13/2011   Procedure: FOREIGN BODY REMOVAL ADULT;  Surgeon: Nicki Reaper, MD;  Location: [facility];  Service: Orthopedics;  Laterality: Left;  removal foreign body index finger metacarpal phalangeal   JOINT REPLACEMENT  2010   lt mod total knee   JOINT REPLACEMENT  2008   rt total knee   KNEE ARTHROPLASTY     rt and lt as young man   KNEE ARTHROSCOPY     right and left   ORIF FINGER FRACTURE  08/2009   rt index   RESECTION DISTAL CLAVICAL Right 02/21/2017   Procedure: DISTAL CLAVICLE EXCISION;   Surgeon: Loreta Ave, MD;  Location: [facility];  Service: Orthopedics;  Laterality: Right;   SHOULDER ARTHROSCOPY WITH ROTATOR CUFF REPAIR AND SUBACROMIAL DECOMPRESSION Right 02/21/2017   Procedure: RIGHT SHOULDER ARTHROSCOPY WITH DEBRIDEMENT, ACROMIOPLASTY, ROTATOR CUFF REPAIR;  Surgeon: Loreta Ave, MD;  Location: [facility];  Service: Orthopedics;  Laterality: Right;   SHOULDER ARTHROSCOPY WITH SUBACROMIAL DECOMPRESSION, ROTATOR CUFF REPAIR AND BICEP TENDON REPAIR Left 07/11/2017   Procedure: LEFT SHOULDER ARTHROSCOPY DEBRIDEMENT WITH SUBACROMIAL DECOMPRESSION, ROTATOR CUFF REPAIR AND BICEP TENODESIS;  Surgeon: Bjorn Pippin, MD;  Location: [facility];  Service: Orthopedics;  Laterality: Left;   TONSILLECTOMY     TOTAL HIP ARTHROPLASTY Right 12/12/2016   TOTAL HIP ARTHROPLASTY Right 12/12/2016   Procedure: TOTAL HIP ARTHROPLASTY ANTERIOR APPROACH;  Surgeon: Loreta Ave, MD;  Location: [facility];  Service: Orthopedics;  Laterality: Right;   Family History Family History  Problem Relation Age of Onset   Heart attack Father        died 89   Heart disease Brother    Hyperlipidemia Brother     Social History Social History   Tobacco Use   Smoking status: Former    Current packs/day: 0.00    Average packs/day: 1 pack/day for 5.0 years (5.0 ttl pk-yrs)    Types: Cigarettes    Start date: 08/07/1966    Quit date: 08/07/1971    Years since quitting: 52.0   Smokeless tobacco: Never  Vaping Use   Vaping status: Never Used  Substance Use Topics   Alcohol use: Yes    Alcohol/week: 10.0 standard drinks of alcohol    Types: 10 Glasses of wine per week    Comment: daily   Drug use: No   Allergies Bee venom, Poison ivy extract, and Lodine [etodolac]  Review of Systems A thorough review of systems was obtained and all systems are negative except as noted in the HPI and PMH.   Physical Exam Vital Signs  I have reviewed the  triage vital signs BP 130/82   Pulse 77   Temp 98 F (36.7 C) (Oral)   Resp 18   SpO2 97%  Physical Exam Vitals and nursing note reviewed.  Constitutional:      General: He is not in acute distress.    Appearance: Normal appearance. He is well-developed.  HENT:     Head: Normocephalic and atraumatic.     Right Ear: External ear normal.     Left Ear: External ear normal.  Mouth/Throat:     Mouth: Mucous membranes are moist.  Eyes:     General: No scleral icterus. Cardiovascular:     Rate and Rhythm: Tachycardia present.     Pulses: Normal pulses.     Heart sounds: Normal heart sounds.  Pulmonary:     Effort: Pulmonary effort is normal. No respiratory distress.     Breath sounds: Normal breath sounds.  Abdominal:     General: Abdomen is flat.     Palpations: Abdomen is soft.     Tenderness: There is no abdominal tenderness.  Musculoskeletal:     Cervical back: No rigidity.     Right lower leg: No edema.     Left lower leg: No edema.  Skin:    General: Skin is warm and dry.     Capillary Refill: Capillary refill takes less than 2 seconds.  Neurological:     Mental Status: He is alert.  Psychiatric:        Mood and Affect: Mood normal.        Behavior: Behavior normal.     ED Results and Treatments Labs (all labs ordered are listed, but only abnormal results are displayed) Labs Reviewed  CBC WITH DIFFERENTIAL/PLATELET - Abnormal; Notable for the following components:      Result Value   RBC 4.21 (*)    Monocytes Absolute 1.1 (*)    All other components within normal limits  BASIC METABOLIC PANEL - Abnormal; Notable for the following components:   Sodium 130 (*)    Chloride 97 (*)    Glucose, Bld 108 (*)    Calcium 8.2 (*)    All other components within normal limits  MAGNESIUM                                                                                                                          Radiology No results found.  Pertinent labs & imaging  results that were available during my care of the patient were reviewed by me and considered in my medical decision making (see MDM for details).  Medications Ordered in ED Medications  etomidate (AMIDATE) injection 10 mg (10 mg Intravenous Not Given 07/31/23 1818)  sodium chloride 0.9 % bolus 1,000 mL (0 mLs Intravenous Stopped 07/31/23 1959)  ondansetron (ZOFRAN) injection 4 mg (4 mg Intravenous Given 07/31/23 1810)  etomidate (AMIDATE) injection (10 mg Intravenous Given 07/31/23 1818)  apixaban (ELIQUIS) tablet 5 mg (5 mg Oral Given 07/31/23 1956)  Procedures .Critical Care  Performed by: Sloan Leiter, DO Authorized by: Sloan Leiter, DO   Critical care provider statement:    Critical care time (minutes):  30   Critical care time was exclusive of:  Separately billable procedures and treating other patients   Critical care was necessary to treat or prevent imminent or life-threatening deterioration of the following conditions:  Cardiac failure   Critical care was time spent personally by me on the following activities:  Development of treatment plan with patient or surrogate, discussions with consultants, evaluation of patient's response to treatment, examination of patient, ordering and review of laboratory studies, ordering and review of radiographic studies, ordering and performing treatments and interventions, pulse oximetry, re-evaluation of patient's condition, review of old charts and obtaining history from patient or surrogate   Care discussed with: admitting provider   .Cardioversion  Date/Time: 07/31/2023 8:36 PM  Performed by: Sloan Leiter, DO Authorized by: Sloan Leiter, DO   Consent:    Consent obtained:  Verbal and written   Consent given by:  Patient   Risks discussed:  Cutaneous burn, death and induced arrhythmia   Alternatives discussed:  No  treatment and rate-control medication Pre-procedure details:    Cardioversion basis:  Emergent   Rhythm:  Atrial flutter   Electrode placement:  Anterior-posterior Patient sedated: Yes. Refer to sedation procedure documentation for details of sedation.  Attempt one:    Cardioversion mode:  Synchronous   Shock (Joules):  150   Shock outcome:  Conversion to normal sinus rhythm Post-procedure details:    Patient status:  Awake   Patient tolerance of procedure:  Tolerated well, no immediate complications .Sedation  Date/Time: 07/31/2023 8:37 PM  Performed by: Sloan Leiter, DO Authorized by: Sloan Leiter, DO   Consent:    Consent obtained:  Written   Consent given by:  Patient   Risks discussed:  Allergic reaction, dysrhythmia, inadequate sedation and nausea   Alternatives discussed:  Analgesia without sedation Universal protocol:    Immediately prior to procedure, a time out was called: yes     Patient identity confirmed:  Arm band and verbally with patient Indications:    Procedure performed:  Cardioversion Pre-sedation assessment:    Time since last food or drink:  4   ASA classification: class 1 - normal, healthy patient     Mouth opening:  2 finger widths   Thyromental distance:  4 finger widths   Mallampati score:  I - soft palate, uvula, fauces, pillars visible   Neck mobility: normal     Pre-sedation assessments completed and reviewed: airway patency, cardiovascular function, hydration status, mental status, nausea/vomiting, pain level, respiratory function and temperature   A pre-sedation assessment was completed prior to the start of the procedure Immediate pre-procedure details:    Reassessment: Patient reassessed immediately prior to procedure     Reviewed: vital signs, relevant labs/tests and NPO status     Verified: bag valve mask available, emergency equipment available, intubation equipment available, IV patency confirmed, oxygen available, reversal medications  available and suction available   Procedure details (see MAR for exact dosages):    Preoxygenation:  Nasal cannula   Sedation:  Etomidate   Intended level of sedation: deep   Intra-procedure monitoring:  Blood pressure monitoring, continuous capnometry, frequent LOC assessments, frequent vital sign checks, continuous pulse oximetry and cardiac monitor   Intra-procedure events: none     Total Provider sedation time (minutes):  17 Post-procedure details:  A post-sedation assessment was completed following the completion of the procedure.   Attendance: Constant attendance by certified staff until patient recovered     Recovery: Patient returned to pre-procedure baseline     Post-sedation assessments completed and reviewed: airway patency, cardiovascular function, hydration status, mental status, nausea/vomiting, pain level, respiratory function and temperature     Patient is stable for discharge or admission: yes     Procedure completion:  Tolerated well, no immediate complications   (including critical care time)  Medical Decision Making / ED Course    Medical Decision Making:    Kenneth Wagner is a 78 y.o. male with past medical history as below, significant for htn, arthritis, hiatal hernia, AAA who presents to the ED with complaint of palpitations.  . The complaint involves an extensive differential diagnosis and also carries with it a high risk of complications and morbidity.  Serious etiology was considered. Ddx includes but is not limited to: Atrial fibrillation, atrial flutter, SVT, arrhythmia, electrolyte derangement, etc.  Complete initial physical exam performed, notably the patient was in no acute distress, heart rate elevated on telemetry.  Blood pressure stable.    Reviewed and confirmed nursing documentation for past medical history, family history, social history.  Vital signs reviewed.     Clinical Course as of 07/31/23 2041  Wed Jul 31, 2023  1752 CHADSVASC is 4  [SG]  1827 Cardioversion successful, now back NSR [SG]  1854 Spoke w/ dr branch, recommend start eliquis and metoprolol, f/u in office [SG]  1857 Sodium(!): 130 Given ivf here, tolerating PO   [SG]  2036 Asymptomatic, continues NSR [SG]    Clinical Course User Index [SG] Sloan Leiter, DO    Brief summary: 78 year old male history as above including AAA, hypertension, CAD here with palpitations.  Initially thought to be SVT at urgent care, after adenosine appears to have atrial flutter.  His CHA2DS2-VASc is 4, he has history of prior AAA has been stable.  Onset of symptoms was this morning.  Recommend DCCV, patient is agreeable.  Also agreeable to sedation.  >> EMS RHYTHM STRIP   New onset aflutter > consulted cardiology as above.   Patient is feeling better on recheck.  Remains in NSR.  He is back to his baseline.  Stable for discharge at the time, spouse will go home with him tonight.  Will follow-up with cardiology in the office.  Was given first dose of Eliquis in the ER  The patient improved significantly and was discharged in stable condition. Detailed discussions were had with the patient/guardian regarding current findings, and need for close f/u with PCP or on call doctor. The patient/guardian has been instructed to return immediately if the symptoms worsen in any way for re-evaluation. Patient/guardian verbalized understanding and is in agreement with current care plan. All questions answered prior to discharge.            Additional history obtained: -Additional history obtained from spouse/ ems -External records from outside source obtained and reviewed including: Chart review including previous notes, labs, imaging, consultation notes including  Primary care documentation, cardiology documentation, home meds   Lab Tests: -I ordered, reviewed, and interpreted labs.   The pertinent results include:   Labs Reviewed  CBC WITH DIFFERENTIAL/PLATELET - Abnormal;  Notable for the following components:      Result Value   RBC 4.21 (*)    Monocytes Absolute 1.1 (*)    All other components within normal limits  BASIC METABOLIC PANEL -  Abnormal; Notable for the following components:   Sodium 130 (*)    Chloride 97 (*)    Glucose, Bld 108 (*)    Calcium 8.2 (*)    All other components within normal limits  MAGNESIUM    Notable for hyooNa mild  EKG   EKG Interpretation Date/Time:  Wednesday July 31 2023 18:21:12 EST Ventricular Rate:  81 PR Interval:  169 QRS Duration:  86 QT Interval:  325 QTC Calculation: 378 R Axis:   70  Text Interpretation: Sinus rhythm s/p cardioversion Confirmed by Tanda Rockers ([id number]) on 07/31/2023 6:29:42 PM         Imaging Studies ordered: na   Medicines ordered and prescription drug management: Meds ordered this encounter  Medications   etomidate (AMIDATE) injection 10 mg   sodium chloride 0.9 % bolus 1,000 mL   ondansetron (ZOFRAN) injection 4 mg   etomidate (AMIDATE) injection   apixaban (ELIQUIS) tablet 5 mg   apixaban (ELIQUIS) 5 MG TABS tablet    Sig: Take 1 tablet (5 mg total) by mouth 2 (two) times daily.    Dispense:  60 tablet    Refill:  0   metoprolol tartrate (LOPRESSOR) 25 MG tablet    Sig: Take 1 tablet (25 mg total) by mouth 3 (three) times daily as needed (HR > 120).    Dispense:  20 tablet    Refill:  0    -I have reviewed the patients home medicines and have made adjustments as needed   Consultations Obtained: I requested consultation with the cardiology,  and discussed lab and imaging findings as well as pertinent plan - they recommend: start beta blocker and eliquis, f/u office   Cardiac Monitoring: The patient was maintained on a cardiac monitor.  I personally viewed and interpreted the cardiac monitored which showed an underlying rhythm of: aflutter > NSR Continuous pulse oximetry interpreted by myself, 99% on RA.    Social Determinants of Health:  Diagnosis or treatment  significantly limited by social determinants of health: former smoker   Reevaluation: After the interventions noted above, I reevaluated the patient and found that they have resolved  Co morbidities that complicate the patient evaluation  Past Medical History:  Diagnosis Date   Allergy    Arthritis    Coronary artery disease    H/O hiatal hernia    Hypertension    No pertinent past medical history       Dispostion: Disposition decision including need for hospitalization was considered, and patient discharged from emergency department.    Final Clinical Impression(s) / ED Diagnoses Final diagnoses:  New onset atrial flutter (HCC)  Palpitations        Sloan Leiter, DO 07/31/23 2041

## 2023-07-31 NOTE — Discharge Instructions (Addendum)
 It was a pleasure caring for you today in the emergency department.  Please avoid medications such as aspirin, ibuprofen, Aleve, NSAIDs while taking Eliquis as this can interact with your Eliquis.   Please return to the emergency department for any worsening or worrisome symptoms.

## 2023-07-31 NOTE — Progress Notes (Signed)
 Patient discussed with ER staff. Prevented with new onset aflutter. New onset symptoms wise, symptoms clearly less <48 hrs, he was electrically cardioverted in the ER succesfully. CHADS2Vasc score is at least 3, recommend starting eliquis 5mg  bid. Rare symptoms, at this time would just start lopressor 25mg  every 8 hrs. F/u labs, would be ok for discharge from ER, we will arrange outpatient f/u  Dina Rich MD

## 2023-07-31 NOTE — ED Triage Notes (Signed)
 Pt arrives via EMS from his doctor's office. PT reports palpitations since this morning. Pt was found to be in SVT. Pt was given 6mg , 12mg , and 12mg  of adenosine. After the 2nd and 3rd dose, the patient briefly became unresponsive. Pt arrives AxOx4.

## 2023-07-31 NOTE — ED Notes (Signed)
 Assumed pt care from [facility]

## 2023-08-01 DIAGNOSIS — N390 Urinary tract infection, site not specified: Secondary | ICD-10-CM | POA: Diagnosis not present

## 2023-08-01 DIAGNOSIS — R509 Fever, unspecified: Secondary | ICD-10-CM | POA: Diagnosis not present

## 2023-08-01 DIAGNOSIS — J101 Influenza due to other identified influenza virus with other respiratory manifestations: Secondary | ICD-10-CM | POA: Diagnosis not present

## 2023-08-01 DIAGNOSIS — Z03818 Encounter for observation for suspected exposure to other biological agents ruled out: Secondary | ICD-10-CM | POA: Diagnosis not present

## 2023-08-12 ENCOUNTER — Ambulatory Visit: Admitting: Nurse Practitioner

## 2023-08-19 NOTE — Progress Notes (Unsigned)
 Cardiology Clinic Note   Patient Name: Kenneth Wagner Date of Encounter: 08/22/2023  Primary Care Provider:  Clayborn Heron, MD Primary Cardiologist:  Maisie Fus, MD  Patient Profile    Kenneth Wagner 78 year old male presents to the clinic today for follow-up evaluation of his atrial flutter.  Past Medical History    Past Medical History:  Diagnosis Date   Allergy    Arthritis    Coronary artery disease    H/O hiatal hernia    Hypertension    No pertinent past medical history    Past Surgical History:  Procedure Laterality Date   CARDIAC CATHETERIZATION  09/2010   CARPAL TUNNEL RELEASE  08/13/2011   Procedure: CARPAL TUNNEL RELEASE;  Surgeon: Nicki Reaper, MD;  Location: [facility];  Service: Orthopedics;  Laterality: Left;   COLONOSCOPY     FOREIGN BODY REMOVAL  08/13/2011   Procedure: FOREIGN BODY REMOVAL ADULT;  Surgeon: Nicki Reaper, MD;  Location: [facility];  Service: Orthopedics;  Laterality: Left;  removal foreign body index finger metacarpal phalangeal   JOINT REPLACEMENT  2010   lt mod total knee   JOINT REPLACEMENT  2008   rt total knee   KNEE ARTHROPLASTY     rt and lt as young man   KNEE ARTHROSCOPY     right and left   ORIF FINGER FRACTURE  08/2009   rt index   RESECTION DISTAL CLAVICAL Right 02/21/2017   Procedure: DISTAL CLAVICLE EXCISION;  Surgeon: Loreta Ave, MD;  Location: [facility];  Service: Orthopedics;  Laterality: Right;   SHOULDER ARTHROSCOPY WITH ROTATOR CUFF REPAIR AND SUBACROMIAL DECOMPRESSION Right 02/21/2017   Procedure: RIGHT SHOULDER ARTHROSCOPY WITH DEBRIDEMENT, ACROMIOPLASTY, ROTATOR CUFF REPAIR;  Surgeon: Loreta Ave, MD;  Location: [facility];  Service: Orthopedics;  Laterality: Right;   SHOULDER ARTHROSCOPY WITH SUBACROMIAL DECOMPRESSION, ROTATOR CUFF REPAIR AND BICEP TENDON REPAIR Left 07/11/2017   Procedure: LEFT SHOULDER ARTHROSCOPY  DEBRIDEMENT WITH SUBACROMIAL DECOMPRESSION, ROTATOR CUFF REPAIR AND BICEP TENODESIS;  Surgeon: Bjorn Pippin, MD;  Location: [facility];  Service: Orthopedics;  Laterality: Left;   TONSILLECTOMY     TOTAL HIP ARTHROPLASTY Right 12/12/2016   TOTAL HIP ARTHROPLASTY Right 12/12/2016   Procedure: TOTAL HIP ARTHROPLASTY ANTERIOR APPROACH;  Surgeon: Loreta Ave, MD;  Location: [facility];  Service: Orthopedics;  Laterality: Right;    Allergies  Allergies  Allergen Reactions   Bee Venom Anaphylaxis   Poison Ivy Extract Hives   Lodine [Etodolac] Other (See Comments)    Gi upset    History of Present Illness    Kenneth Wagner has a PMH of HTN, hiatal hernia, AAA, and atrial flutter.  He presented to the emergency department on 07/31/2023.  He complained of palpitations.  He reported that he woke up around 6 AM with his heart racing.  He also noted that his heartbeat felt irregular.  The previous night when he went to bed he reported that he felt normal.  He also noted that he had similar symptoms several years prior which resolved spontaneously.  He had gone to urgent care and his heart rate was in the 160s.  EMS presented to urgent care.  He received adenosine x 3.  He did not convert back to sinus rhythm.  EKG showed what appeared to be atrial flutter.  He was then seen in the ED 4/24 with palpitations.  His workup  at that time showed PVCs.  He was discharged in stable condition with cardiology follow-up.  He then had cardiac monitoring which was reassuring.  His CHA2DS2-VASc score was 4.  DCCV was recommended.  He received Eliquis.  He received sedation.  He received 1 shock at 150 J and converted to sinus rhythm.  His BMP showed sodium of 130, potassium 3.8 and a glucose of 108.  His CBC showed WBCs of 4.3 and RBCs of 4.21.  His hemoglobin hematocrit were within normal range.  He was discharged in stable condition on 07/31/2023.  He presents to the clinic today for follow-up  evaluation and states he is planning to be more physically active.  He has not been taking metoprolol.  He reports that he is only taking half tablet of atorvastatin and losartan.  We reviewed his urgent care visit and emergency department visit.  We reviewed his DCCV.  He expressed understanding.  He reports compliance with his apixaban.  We reviewed triggers for palpitations.  He lives locally and would like to follow-up with a local MD.  I will have him continue metoprolol as needed.  Will plan follow-up in 3 to 4 months.  Today he denies chest pain, shortness of breath, lower extremity edema, fatigue, palpitations, melena, hematuria, hemoptysis, diaphoresis, weakness, presyncope, syncope, orthopnea, and PND.    Home Medications    Prior to Admission medications   Medication Sig Start Date End Date Taking? Authorizing Provider  apixaban (ELIQUIS) 5 MG TABS tablet Take 1 tablet (5 mg total) by mouth 2 (two) times daily. 07/31/23 08/30/23  Sloan Leiter, DO  atorvastatin (LIPITOR) 10 MG tablet Take 10 mg by mouth daily.    [provider]  cholecalciferol (VITAMIN D3) 25 MCG (1000 UNIT) tablet Take 1,000 Units by mouth daily.    [provider]  co-enzyme Q-10 30 MG capsule Take 30 mg by mouth daily.    [provider]  cyanocobalamin (VITAMIN B12) 1000 MCG tablet Take 1,000 mcg by mouth daily.    [provider]  EPINEPHrine 0.3 mg/0.3 mL IJ SOAJ injection Inject 0.3 mg into the muscle as needed for anaphylaxis. 12/27/1991   [provider]  glucosamine-chondroitin 500-400 MG tablet Take 2 tablets by mouth daily.    [provider]  losartan (COZAAR) 25 MG tablet Take 25 mg by mouth daily.    [provider]  metoprolol tartrate (LOPRESSOR) 25 MG tablet Take 1 tablet (25 mg total) by mouth 3 (three) times daily as needed (HR > 120). 07/31/23   Sloan Leiter, DO  sildenafil (VIAGRA) 100 MG tablet Take 100 mg by mouth daily as needed for  erectile dysfunction.    [provider]  testosterone cypionate (DEPOTESTOSTERONE CYPIONATE) 200 MG/ML injection Inject 200 mg into the muscle once a week. Patient has been taking 0.05    [provider]    Family History    Family History  Problem Relation Age of Onset   Heart attack Father        died 80   Heart disease Brother    Hyperlipidemia Brother    He indicated that his mother is deceased. He indicated that his father is deceased. He indicated that his sister is alive. He indicated that both of his brothers are alive.  Social History    Social History   Socioeconomic History   Marital status: Married    Spouse name: Not on file   Number of children: Not on file  Years of education: Not on file   Highest education level: Not on file  Occupational History   Not on file  Tobacco Use   Smoking status: Former    Current packs/day: 0.00    Average packs/day: 1 pack/day for 5.0 years (5.0 ttl pk-yrs)    Types: Cigarettes    Start date: 08/07/1966    Quit date: 08/07/1971    Years since quitting: 52.0   Smokeless tobacco: Never  Vaping Use   Vaping status: Never Used  Substance and Sexual Activity   Alcohol use: Yes    Alcohol/week: 10.0 standard drinks of alcohol    Types: 10 Glasses of wine per week    Comment: daily   Drug use: No   Sexual activity: Yes    Birth control/protection: None  Other Topics Concern   Not on file  Social History Narrative   Not on file   Social Drivers of Health   Financial Resource Strain: Not on file  Food Insecurity: Low Risk  (05/08/2023)   Received from [facility]   Hunger Vital Sign    Worried About Running Out of Food in the Last Year: Never true    Ran Out of Food in the Last Year: Never true  Transportation Needs: No Transportation Needs (05/08/2023)   Received from [facility]    In the past 12 months, has lack of reliable transportation kept you from medical appointments,  meetings, work or from getting things needed for daily living? : No  Physical Activity: Not on file  Stress: Not on file  Social Connections: Not on file  Intimate Partner Violence: Not on file     Review of Systems    General:  No chills, fever, night sweats or weight changes.  Cardiovascular:  No chest pain, dyspnea on exertion, edema, orthopnea, palpitations, paroxysmal nocturnal dyspnea. Dermatological: No rash, lesions/masses Respiratory: No cough, dyspnea Urologic: No hematuria, dysuria Abdominal:   No nausea, vomiting, diarrhea, bright red blood per rectum, melena, or hematemesis Neurologic:  No visual changes, wkns, changes in mental status. All other systems reviewed and are otherwise negative except as noted above.  Physical Exam    VS:  BP 112/64 (BP Location: Left Arm, Patient Position: Sitting, Cuff Size: Normal)   Pulse 67   Ht 6' (1.829 m)   Wt 169 lb (76.7 kg)   SpO2 98%   BMI 22.92 kg/m  , BMI Body mass index is 22.92 kg/m. GEN: Well nourished, well developed, in no acute distress. HEENT: normal. Neck: Supple, no JVD, carotid bruits, or masses. Cardiac: RRR, no murmurs, rubs, or gallops. No clubbing, cyanosis, edema.  Radials/DP/PT 2+ and equal bilaterally.  Respiratory:  Respirations regular and unlabored, clear to auscultation bilaterally. GI: Soft, nontender, nondistended, BS + x 4. MS: no deformity or atrophy. Skin: warm and dry, no rash. Neuro:  Strength and sensation are intact. Psych: Normal affect.  Accessory Clinical Findings    Recent Labs: 07/31/2023: BUN 15; Creatinine, Ser 1.12; Hemoglobin 14.1; Magnesium 2.0; Platelets 173; Potassium 3.8; Sodium 130   Recent Lipid Panel No results found for: "CHOL", "TRIG", "HDL", "CHOLHDL", "VLDL", "LDLCALC", "LDLDIRECT"       ECG personally reviewed by me today- EKG Interpretation Date/Time:  Thursday August 22 2023 08:03:34 EDT Ventricular Rate:  67 PR Interval:  154 QRS Duration:  86 QT  Interval:  398 QTC Calculation: 420 R Axis:   72  Text Interpretation: Sinus rhythm with occasional Premature ventricular complexes Abnormal  QRS-T angle, consider primary T wave abnormality When compared with ECG of 31-Jul-2023 18:21, PREVIOUS ECG IS PRESENT Confirmed by Edd Fabian [phone]) on 08/22/2023 8:08:31 AM    Cardiac event monitor 10/17/2022  1 triggered event for sinus rhythm. Artifact, no significant VT. Minimal ectopy. No significant tachyarrhythmia or bradyarrhythmia. No atrial fibrillation or flutter.       Patch Wear Time:  7 days and 2 hours (2024-05-10T09:07:28-0400 to 2024-05-17T11:48:10-398)   Patient had a min HR of 40 bpm, max HR of 226 bpm, and avg HR of 64 bpm. Predominant underlying rhythm was Sinus Rhythm. 4 Ventricular Tachycardia runs occurred, the run with the fastest interval lasting 4 beats with a max rate of 226 bpm, the longest  lasting 6 beats with an avg rate of 145 bpm. 3 Supraventricular Tachycardia runs occurred, the run with the fastest interval lasting 5 beats with a max rate of 174 bpm, the longest lasting 5 beats with an avg rate of 134 bpm. Isolated SVEs were rare  (<1.0%), SVE Couplets were rare (<1.0%), and no SVE Triplets were present. Isolated VEs were rare (<1.0%), VE Couplets were rare (<1.0%), and no VE Triplets were present.     Assessment & Plan   1.  Atrial flutter-EKG today shows sinus rhythm with pac's 67 bpm.  Reports compliance with apixaban.  Denies bleeding issues.  CHA2DS2-VASc score 4.This patients CHA2DS2-VASc Score and unadjusted Ischemic Stroke Rate (% per year) is equal to 3.2 % stroke rate/year from a score of 3 Above score calculated as 1 point each if present [facility],  Age > 75] Avoid triggers caffeine, chocolate, EtOH, dehydration etc. Continue Eliquis, Continue metoprolol as needed  Essential hypertension-BP today 112/64. Maintain blood pressure log Heart healthy diet Continue losartan,  metoprolol  Hyperlipidemia-LDL 48 on 06/19/23. High-fiber diet Continue atorvastatin Increase physical activity as tolerated  Disposition: Follow-up with Dr. Bjorn Pippin or me in 3-4 months.   Thomasene Ripple. Veda Arrellano NP-C     08/22/2023, 8:09 AM [facility] [address] Office [phone] Fax [phone]    I spent 15 minutes examining this patient, reviewing medications, and using patient centered shared decision making involving their cardiac care.   I spent  20 minutes reviewing past medical history,  medications, and prior cardiac tests.

## 2023-08-20 DIAGNOSIS — M7541 Impingement syndrome of right shoulder: Secondary | ICD-10-CM | POA: Diagnosis not present

## 2023-08-22 ENCOUNTER — Ambulatory Visit: Attending: General Practice | Admitting: General Practice

## 2023-08-22 ENCOUNTER — Encounter: Payer: Self-pay | Admitting: General Practice

## 2023-08-22 VITALS — BP 112/64 | HR 67 | Ht 72.0 in | Wt 169.0 lb

## 2023-08-22 DIAGNOSIS — I483 Typical atrial flutter: Secondary | ICD-10-CM | POA: Diagnosis not present

## 2023-08-22 DIAGNOSIS — I10 Essential (primary) hypertension: Secondary | ICD-10-CM | POA: Diagnosis not present

## 2023-08-22 DIAGNOSIS — R002 Palpitations: Secondary | ICD-10-CM | POA: Diagnosis not present

## 2023-08-22 DIAGNOSIS — E782 Mixed hyperlipidemia: Secondary | ICD-10-CM | POA: Diagnosis not present

## 2023-08-22 MED ORDER — METOPROLOL TARTRATE 25 MG PO TABS
25.0000 mg | ORAL_TABLET | Freq: Three times a day (TID) | ORAL | 0 refills | Status: AC | PRN
Start: 2023-08-22 — End: ?

## 2023-08-22 NOTE — Patient Instructions (Signed)
 Medication Instructions:  TAKE YOUR METOPROLOL 25MG  THREE-TIMES-DAILY AS NEEDED HEART RATE >120 *If you need a refill on your cardiac medications before your next appointment, please call your pharmacy*  Lab Work: NONE  Testing/Procedures: NONE  Follow-Up: At [facility], you and your health needs are our priority.  As part of our continuing mission to provide you with exceptional heart care, we have created designated Provider Care Teams.  These Care Teams include your primary Cardiologist (physician) and Advanced Practice Providers (APPs -  Physician Assistants and Nurse Practitioners) who all work together to provide you with the care you need, when you need it.   Your next appointment:   3-4 month(s)  Provider:   Little Ishikawa, MD  or Edd Fabian, FNP        Other Instructions MAKE SURE YOUR STAY HYDRATED Please try to avoid these triggers: Do not use any products that have nicotine or tobacco in them. These include cigarettes, e-cigarettes, and chewing tobacco. If you need help quitting, ask your doctor. Eat heart-healthy foods. Talk with your doctor about the right eating plan for you. Exercise regularly as told by your doctor. Stay hydrated Do not drink alcohol, Caffeine or chocolate. Lose weight if you are overweight. Do not use drugs, including cannabis  s

## 2023-08-27 ENCOUNTER — Encounter: Payer: Self-pay | Admitting: Cardiology

## 2023-08-27 MED ORDER — APIXABAN 5 MG PO TABS
5.0000 mg | ORAL_TABLET | Freq: Two times a day (BID) | ORAL | 11 refills | Status: DC
Start: 2023-08-27 — End: 2023-12-24

## 2023-09-02 ENCOUNTER — Telehealth: Payer: Self-pay | Admitting: Cardiology

## 2023-09-02 NOTE — Telephone Encounter (Signed)
 Patient identification verified by 2 forms. Marilynn Rail, RN    Called and spoke to patient  Patient states:   -he is not happy with Eliquis Rx   -he is experiencing some concerning side effects   -side effects: dizzy, "spacey", lightheadedness, mental attitude is clouded, decreased out look on things  -unsure if symptoms are related to eliquis  -He has quit alcohol and caffeine intake   -does not know if symptoms related to heart condition  Informed patient message sent to Dr. Bjorn Pippin and pharmacy team for assistance  Patient verbalized understanding, no questions at this time

## 2023-09-02 NOTE — Telephone Encounter (Signed)
 Pt c/o medication issue:  1. Name of Medication:   metoprolol tartrate (LOPRESSOR) 25 MG tablet  apixaban (ELIQUIS) 5 MG TABS tablet   losartan (COZAAR) 25 MG tablet   2. How are you currently taking this medication (dosage and times per day)? As Written  3. Are you having a reaction (difficulty breathing--STAT)? No  4. What is your medication issue? Patient stated he had medication changes. Patient stated the changes are not working for him and would like to speak with a nurse in regard to this. Please advise.

## 2023-09-03 NOTE — Telephone Encounter (Signed)
 Pavero, Christopher, RPH  You; Little Ishikawa, MD31 minutes ago (1:34 PM)    Those do not sound like typical Eliquis side effects. Sounds more like side effects to HTN medications.    Patient identification verified by 2 forms. Marilynn Rail, RN    Called and spoke to patient  Relayed pharmacist message  Advised patient to follow up at 4/11 OV  Patient agrees, no further questions at this time

## 2023-09-06 ENCOUNTER — Ambulatory Visit: Attending: Cardiology | Admitting: Cardiology

## 2023-09-06 ENCOUNTER — Encounter: Payer: Self-pay | Admitting: Cardiology

## 2023-09-06 VITALS — BP 135/81 | HR 50 | Ht 72.0 in | Wt 166.8 lb

## 2023-09-06 DIAGNOSIS — I483 Typical atrial flutter: Secondary | ICD-10-CM

## 2023-09-06 DIAGNOSIS — I7143 Infrarenal abdominal aortic aneurysm, without rupture: Secondary | ICD-10-CM | POA: Diagnosis not present

## 2023-09-06 DIAGNOSIS — I10 Essential (primary) hypertension: Secondary | ICD-10-CM | POA: Diagnosis not present

## 2023-09-06 DIAGNOSIS — E782 Mixed hyperlipidemia: Secondary | ICD-10-CM

## 2023-09-06 NOTE — Progress Notes (Signed)
 Cardiology Office Note:    Date:  09/06/2023   ID:  Kenneth Wagner, DOB 12/05/1945, MRN [id number]  PCP:  Clayborn Heron, MD  Cardiologist:  Little Ishikawa, MD  Electrophysiologist:  None   Referring MD: Clayborn Heron, MD   Chief Complaint  Patient presents with   Atrial Flutter    History of Present Illness:    Kenneth Wagner is a 78 y.o. male with a hx of atrial flutter, hypertension, AAA who presents for follow-up.  He was seen in the ED on 07/31/2023 with new onset atrial flutter.  Underwent DCCV to sinus rhythm.  He was started on Eliquis and as needed metoprolol.  He reports he was diagnosed with atrial flutter 25 years ago.  He underwent LHC at that time, was told there was no obstructive CAD.  He had recurrence of atrial flutter in 2024 and again 07/2023.  Episode in March was the first time he had to be cardioverted.  Since starting Eliquis, he reports having dizziness and states that his mood has worsened.  Denies any syncope.  Denies any chest pain, dyspnea, lower extremity edema.  Has not felt that he has had more A-fib since his episode in March.  He walks 4 to 5 miles per day.  Has lost nearly 40 pounds in last 5 years.   Past Medical History:  Diagnosis Date   Allergy    Arthritis    Coronary artery disease    H/O hiatal hernia    Hypertension    No pertinent past medical history     Past Surgical History:  Procedure Laterality Date   CARDIAC CATHETERIZATION  09/2010   CARPAL TUNNEL RELEASE  08/13/2011   Procedure: CARPAL TUNNEL RELEASE;  Surgeon: Nicki Reaper, MD;  Location: [facility];  Service: Orthopedics;  Laterality: Left;   COLONOSCOPY     FOREIGN BODY REMOVAL  08/13/2011   Procedure: FOREIGN BODY REMOVAL ADULT;  Surgeon: Nicki Reaper, MD;  Location: [facility];  Service: Orthopedics;  Laterality: Left;  removal foreign body index finger metacarpal phalangeal   JOINT REPLACEMENT  2010   lt mod total  knee   JOINT REPLACEMENT  2008   rt total knee   KNEE ARTHROPLASTY     rt and lt as young man   KNEE ARTHROSCOPY     right and left   ORIF FINGER FRACTURE  08/2009   rt index   RESECTION DISTAL CLAVICAL Right 02/21/2017   Procedure: DISTAL CLAVICLE EXCISION;  Surgeon: Loreta Ave, MD;  Location: [facility];  Service: Orthopedics;  Laterality: Right;   SHOULDER ARTHROSCOPY WITH ROTATOR CUFF REPAIR AND SUBACROMIAL DECOMPRESSION Right 02/21/2017   Procedure: RIGHT SHOULDER ARTHROSCOPY WITH DEBRIDEMENT, ACROMIOPLASTY, ROTATOR CUFF REPAIR;  Surgeon: Loreta Ave, MD;  Location: [facility];  Service: Orthopedics;  Laterality: Right;   SHOULDER ARTHROSCOPY WITH SUBACROMIAL DECOMPRESSION, ROTATOR CUFF REPAIR AND BICEP TENDON REPAIR Left 07/11/2017   Procedure: LEFT SHOULDER ARTHROSCOPY DEBRIDEMENT WITH SUBACROMIAL DECOMPRESSION, ROTATOR CUFF REPAIR AND BICEP TENODESIS;  Surgeon: Bjorn Pippin, MD;  Location:  SURGERY CENTER;  Service: Orthopedics;  Laterality: Left;   TONSILLECTOMY     TOTAL HIP ARTHROPLASTY Right 12/12/2016   TOTAL HIP ARTHROPLASTY Right 12/12/2016   Procedure: TOTAL HIP ARTHROPLASTY ANTERIOR APPROACH;  Surgeon: Loreta Ave, MD;  Location: [facility] OR;  Service: Orthopedics;  Laterality: Right;    Current Medications: Current Meds  Medication Sig  apixaban (ELIQUIS) 5 MG TABS tablet Take 1 tablet (5 mg total) by mouth 2 (two) times daily.   atorvastatin (LIPITOR) 10 MG tablet Take 10 mg by mouth daily. Takes one half tablet daily   cholecalciferol (VITAMIN D3) 25 MCG (1000 UNIT) tablet Take 1,000 Units by mouth daily.   EPINEPHrine 0.3 mg/0.3 mL IJ SOAJ injection Inject 0.3 mg into the muscle as needed for anaphylaxis.   losartan (COZAAR) 25 MG tablet Take 12.5 mg by mouth daily.   metoprolol tartrate (LOPRESSOR) 25 MG tablet Take 1 tablet (25 mg total) by mouth 3 (three) times daily as needed (HR > 120).   sildenafil (VIAGRA)  100 MG tablet Take 100 mg by mouth daily as needed for erectile dysfunction.   testosterone cypionate (DEPOTESTOSTERONE CYPIONATE) 200 MG/ML injection Inject 200 mg into the muscle once a week. Patient has been taking 0.05     Allergies:   Bee venom, Poison ivy extract, and Lodine [etodolac]   Social History   Socioeconomic History   Marital status: Married    Spouse name: Not on file   Number of children: Not on file   Years of education: Not on file   Highest education level: Not on file  Occupational History   Not on file  Tobacco Use   Smoking status: Former    Current packs/day: 0.00    Average packs/day: 1 pack/day for 5.0 years (5.0 ttl pk-yrs)    Types: Cigarettes    Start date: 08/07/1966    Quit date: 08/07/1971    Years since quitting: 52.1   Smokeless tobacco: Never  Vaping Use   Vaping status: Never Used  Substance and Sexual Activity   Alcohol use: Yes    Alcohol/week: 10.0 standard drinks of alcohol    Types: 10 Glasses of wine per week    Comment: daily   Drug use: No   Sexual activity: Yes    Birth control/protection: None  Other Topics Concern   Not on file  Social History Narrative   Not on file   Social Drivers of Health   Financial Resource Strain: Not on file  Food Insecurity: Low Risk  (05/08/2023)   Received from [facility]   Hunger Vital Sign    Worried About Running Out of Food in the Last Year: Never true    Ran Out of Food in the Last Year: Never true  Transportation Needs: No Transportation Needs (05/08/2023)   Received from [facility]    In the past 12 months, has lack of reliable transportation kept you from medical appointments, meetings, work or from getting things needed for daily living? : No  Physical Activity: Not on file  Stress: Not on file  Social Connections: Not on file     Family History: The patient's family history includes Heart attack in his father; Heart disease in his brother;  Hyperlipidemia in his brother.  ROS:   Please see the history of present illness.     All other systems reviewed and are negative.  EKGs/Labs/Other Studies Reviewed:    The following studies were reviewed today:   EKG:   09/06/2023: Sinus bradycardia, rate 48, no ST abnormalities  Recent Labs: 07/31/2023: BUN 15; Creatinine, Ser 1.12; Hemoglobin 14.1; Magnesium 2.0; Platelets 173; Potassium 3.8; Sodium 130  Recent Lipid Panel No results found for: "CHOL", "TRIG", "HDL", "CHOLHDL", "VLDL", "LDLCALC", "LDLDIRECT"  Physical Exam:    VS:  BP 135/81   Pulse (!) 50  Ht 6' (1.829 m)   Wt 166 lb 12.8 oz (75.7 kg)   SpO2 100%   BMI 22.62 kg/m     Wt Readings from Last 3 Encounters:  09/06/23 166 lb 12.8 oz (75.7 kg)  08/22/23 169 lb (76.7 kg)  10/01/22 173 lb 3.2 oz (78.6 kg)     GEN:  Well nourished, well developed in no acute distress HEENT: Normal NECK: No JVD; No carotid bruits LYMPHATICS: No lymphadenopathy CARDIAC: RRR, no murmurs, rubs, gallops RESPIRATORY:  Clear to auscultation without rales, wheezing or rhonchi  ABDOMEN: Soft, non-tender, non-distended MUSCULOSKELETAL:  No edema; No deformity  SKIN: Warm and dry NEUROLOGIC:  Alert and oriented x 3 PSYCHIATRIC:  Normal affect   ASSESSMENT:    1. Typical atrial flutter (HCC)   2. Essential hypertension   3. Mixed hyperlipidemia   4. Infrarenal abdominal aortic aneurysm (AAA) without rupture (HCC)    PLAN:    Atrial flutter: Diagnosed with atrial flutter 25 years ago, has had 2 episodes in last year, required cardioversion 07/2023 in the ED.  CHA2DS2-VASc 3 (hypertension, age x 2) - Continue Eliquis 5 mg twice daily.  Check CBC - Continue as needed metoprolol - Check echocardiogram - Refer to EP to consider ablation  Hypertension: On losartan 12.5 mg daily.  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  Follow-up recommended in 5  years  Hyperlipidemia: On atorvastatin 5 mg daily.  LDL 48 on 06/19/2023  RTC in 3 months   Medication Adjustments/Labs and Tests Ordered: Current medicines are reviewed at length with the patient today.  Concerns regarding medicines are outlined above.  Orders Placed This Encounter  Procedures   CBC w/Diff/Platelet   Comprehensive Metabolic Panel (CMET)   Ambulatory referral to Cardiac Electrophysiology   EKG 12-Lead   ECHOCARDIOGRAM COMPLETE   No orders of the defined types were placed in this encounter.   Patient Instructions  Medication Instructions:  Continue current medications *If you need a refill on your cardiac medications before your next appointment, please call your pharmacy*  Lab Work: Cbc, cmet If you have labs (blood work) drawn today and your tests are completely normal, you will receive your results only by: MyChart Message (if you have MyChart) OR A paper copy in the mail If you have any lab test that is abnormal or we need to change your treatment, we will call you to review the results.  Testing/Procedures: Echo  Your physician has requested that you have an echocardiogram. Echocardiography is a painless test that uses sound waves to create images of your heart. It provides your doctor with information about the size and shape of your heart and how well your heart's chambers and valves are working. This procedure takes approximately one hour. There are no restrictions for this procedure. Please do NOT wear cologne, perfume, aftershave, or lotions (deodorant is allowed). Please arrive 15 minutes prior to your appointment time.  Please note: We ask at that you not bring children with you during ultrasound (echo/ vascular) testing. Due to room size and safety concerns, children are not allowed in the ultrasound rooms during exams. Our front office staff cannot provide observation of children in our lobby area while testing is being conducted. An adult  accompanying a patient to their appointment will only be allowed in the ultrasound room at the discretion of the ultrasound technician under special circumstances. We apologize for any inconvenience.   Follow-Up:  At [facility], you and your health needs are our priority.  As part of our continuing mission to provide you with exceptional heart care, our providers are all part of one team.  This team includes your primary Cardiologist (physician) and Advanced Practice Providers or APPs (Physician Assistants and Nurse Practitioners) who all work together to provide you with the care you need, when you need it.  Your next appointment:   Keep your schedule appt  Provider:   Little Ishikawa, MD     We recommend signing up for the patient portal called "MyChart".  Sign up information is provided on this After Visit Summary.  MyChart is used to connect with patients for Virtual Visits (Telemedicine).  Patients are able to view lab/test results, encounter notes, upcoming appointments, etc.  Non-urgent messages can be sent to your provider as well.   To learn more about what you can do with MyChart, go to [email or web address].   Other Instructions Referral to EP      1st Floor: - Lobby - Registration  - Pharmacy  - Lab - Cafe  2nd Floor: - PV Lab - Diagnostic Testing (echo, CT, nuclear med)  3rd Floor: - Vacant  4th Floor: - TCTS (cardiothoracic surgery) - AFib Clinic - Structural Heart Clinic - Vascular Surgery  - Vascular Ultrasound  5th Floor: - HeartCare Cardiology (general and EP) - Clinical Pharmacy for coumadin, hypertension, lipid, weight-loss medications, and med management appointments    Valet parking services will be available as well.      Signed, Little Ishikawa, MD  09/06/2023 11:24 AM    [facility]

## 2023-09-06 NOTE — Patient Instructions (Signed)
 Medication Instructions:  Continue current medications *If you need a refill on your cardiac medications before your next appointment, please call your pharmacy*  Lab Work: Cbc, cmet If you have labs (blood work) drawn today and your tests are completely normal, you will receive your results only by: MyChart Message (if you have MyChart) OR A paper copy in the mail If you have any lab test that is abnormal or we need to change your treatment, we will call you to review the results.  Testing/Procedures: Echo  Your physician has requested that you have an echocardiogram. Echocardiography is a painless test that uses sound waves to create images of your heart. It provides your doctor with information about the size and shape of your heart and how well your heart's chambers and valves are working. This procedure takes approximately one hour. There are no restrictions for this procedure. Please do NOT wear cologne, perfume, aftershave, or lotions (deodorant is allowed). Please arrive 15 minutes prior to your appointment time.  Please note: We ask at that you not bring children with you during ultrasound (echo/ vascular) testing. Due to room size and safety concerns, children are not allowed in the ultrasound rooms during exams. Our front office staff cannot provide observation of children in our lobby area while testing is being conducted. An adult accompanying a patient to their appointment will only be allowed in the ultrasound room at the discretion of the ultrasound technician under special circumstances. We apologize for any inconvenience.   Follow-Up: At [facility], you and your health needs are our priority.  As part of our continuing mission to provide you with exceptional heart care, our providers are all part of one team.  This team includes your primary Cardiologist (physician) and Advanced Practice Providers or APPs (Physician Assistants and Nurse Practitioners) who all work  together to provide you with the care you need, when you need it.  Your next appointment:   Keep your schedule appt  Provider:   Little Ishikawa, MD     We recommend signing up for the patient portal called "MyChart".  Sign up information is provided on this After Visit Summary.  MyChart is used to connect with patients for Virtual Visits (Telemedicine).  Patients are able to view lab/test results, encounter notes, upcoming appointments, etc.  Non-urgent messages can be sent to your provider as well.   To learn more about what you can do with MyChart, go to [email or web address].   Other Instructions Referral to EP      1st Floor: - Lobby - Registration  - Pharmacy  - Lab - Cafe  2nd Floor: - PV Lab - Diagnostic Testing (echo, CT, nuclear med)  3rd Floor: - Vacant  4th Floor: - TCTS (cardiothoracic surgery) - AFib Clinic - Structural Heart Clinic - Vascular Surgery  - Vascular Ultrasound  5th Floor: - HeartCare Cardiology (general and EP) - Clinical Pharmacy for coumadin, hypertension, lipid, weight-loss medications, and med management appointments    Valet parking services will be available as well.

## 2023-09-07 LAB — COMPREHENSIVE METABOLIC PANEL WITH GFR
ALT: 13 IU/L (ref 0–44)
AST: 24 IU/L (ref 0–40)
Albumin: 4.2 g/dL (ref 3.8–4.8)
Alkaline Phosphatase: 55 IU/L (ref 44–121)
BUN/Creatinine Ratio: 10 (ref 10–24)
BUN: 9 mg/dL (ref 8–27)
Bilirubin Total: 0.4 mg/dL (ref 0.0–1.2)
CO2: 24 mmol/L (ref 20–29)
Calcium: 8.8 mg/dL (ref 8.6–10.2)
Chloride: 97 mmol/L (ref 96–106)
Creatinine, Ser: 0.88 mg/dL (ref 0.76–1.27)
Globulin, Total: 1.7 g/dL (ref 1.5–4.5)
Glucose: 72 mg/dL (ref 70–99)
Potassium: 5 mmol/L (ref 3.5–5.2)
Sodium: 134 mmol/L (ref 134–144)
Total Protein: 5.9 g/dL — ABNORMAL LOW (ref 6.0–8.5)
eGFR: 89 mL/min/{1.73_m2} (ref >59)

## 2023-09-07 LAB — CBC WITH DIFFERENTIAL/PLATELET
Basophils Absolute: 0.1 10*3/uL (ref 0.0–0.2)
Basos: 2 %
EOS (ABSOLUTE): 0.2 10*3/uL (ref 0.0–0.4)
Eos: 4 %
Hematocrit: 40.6 % (ref 37.5–51.0)
Hemoglobin: 13.9 g/dL (ref 13.0–17.7)
Immature Grans (Abs): 0 10*3/uL (ref 0.0–0.1)
Immature Granulocytes: 0 %
Lymphocytes Absolute: 1.3 10*3/uL (ref 0.7–3.1)
Lymphs: 30 %
MCH: 33.1 pg — ABNORMAL HIGH (ref 26.6–33.0)
MCHC: 34.2 g/dL (ref 31.5–35.7)
MCV: 97 fL (ref 79–97)
Monocytes Absolute: 0.8 10*3/uL (ref 0.1–0.9)
Monocytes: 19 %
Neutrophils Absolute: 1.9 10*3/uL (ref 1.4–7.0)
Neutrophils: 45 %
Platelets: 183 10*3/uL (ref 150–450)
RBC: 4.2 x10E6/uL (ref 4.14–5.80)
RDW: 14.1 % (ref 11.6–15.4)
WBC: 4.4 10*3/uL (ref 3.4–10.8)

## 2023-09-11 ENCOUNTER — Encounter: Payer: Self-pay | Admitting: *Deleted

## 2023-09-27 DIAGNOSIS — Z1283 Encounter for screening for malignant neoplasm of skin: Secondary | ICD-10-CM | POA: Diagnosis not present

## 2023-09-27 DIAGNOSIS — D225 Melanocytic nevi of trunk: Secondary | ICD-10-CM | POA: Diagnosis not present

## 2023-09-27 DIAGNOSIS — B078 Other viral warts: Secondary | ICD-10-CM | POA: Diagnosis not present

## 2023-09-27 DIAGNOSIS — L821 Other seborrheic keratosis: Secondary | ICD-10-CM | POA: Diagnosis not present

## 2023-09-30 NOTE — Progress Notes (Unsigned)
 Electrophysiology Office Note:   Date:  10/02/2023  ID:  Kenneth Wagner, DOB 06/20/1945, MRN [id number]  Primary Cardiologist: Wendie Hamburg, MD Electrophysiologist: Ardeen Kohler, MD      History of Present Illness:   Kenneth Wagner is a 78 y.o. male with h/o atrial flutter, hypertension, AAA who is being seen today for evaluation of his atrial flutter at the request of Dr. Alda Amas.   Discussed the use of AI scribe software for clinical note transcription with the patient, who gave verbal consent to proceed.  History of Present Illness He was seen in the ED on 07/31/2023 with atrial flutter.  Underwent DCCV to sinus rhythm.  He reports he was diagnosed with atrial flutter 25 years ago. He had recurrence of atrial flutter in 2024 and again 07/2023. His first 2 episodes resolved spontaneously. On March 5th, he had a severe episode requiring emergency medical intervention, including medication and electrical cardioversion at [facility]. Following this recent episode, he began taking Eliquis . He feels 'spacey' and dizzy, which he suspects may be side effects of the medication. He is concerned about the bleeding risk associated with Eliquis  and wants to discontinue it. He has abstained from alcohol and switched to decaf coffee for the past 60 days, though he occasionally consumes chocolate. He is concerned about the impact of these lifestyle changes on his quality of life. Otherwise doing well.   Review of systems complete and found to be negative unless listed in HPI.   EP Information / Studies Reviewed:    EKG is not ordered today. EKG from 09/06/23 reviewed which showed sinus brady, 48bpm.  PR , QRS 86ms.      EKG 07/31/23: AFL   Echo pending: Scheduled 10/11/23.  Risk Assessment/Calculations:    CHA2DS2-VASc Score = 4   This indicates a 4.8% annual risk of stroke. The patient's score is based upon: CHF History: 0 HTN History: 1 Diabetes History: 0 Stroke  History: 0 Vascular Disease History: 1 Age Score: 2 Gender Score: 0             Physical Exam:   VS:  BP 110/66 (BP Location: Left Arm, Patient Position: Sitting, Cuff Size: Normal)   Pulse (!) 57   Resp 16   Ht 6' (1.829 m)   Wt 169 lb 12.8 oz (77 kg)   SpO2 98%   BMI 23.03 kg/m    Wt Readings from Last 3 Encounters:  10/01/23 169 lb 12.8 oz (77 kg)  09/06/23 166 lb 12.8 oz (75.7 kg)  08/22/23 169 lb (76.7 kg)     GEN: Well nourished, well developed in no acute distress NECK: No JVD CARDIAC: N RESPIRATORY:  Clear to auscultation without rales, wheezing or rhonchi  ABDOMEN: Soft, non-distended EXTREMITIES:  No edema; No deformity   ASSESSMENT AND PLAN:    #. Typical appearing atrial flutter:  #. 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  -Discussed treatment options today for AFL including antiarrhythmic drug therapy and ablation. Discussed risks, recovery and likelihood of success with each treatment strategy. Risk, benefits, and alternatives to EP study and ablation for atrial flutter were discussed. These risks include but are not limited to stroke, bleeding, vascular damage, tamponade, perforation, damage to the esophagus, lungs, phrenic nerve and other structures, worsening renal function, coronary vasospasm and death.  Discussed potential need for repeat ablation procedures and antiarrhythmic drugs after an initial ablation. The patient understands these risk and wishes to proceed.  We  will therefore proceed with catheter ablation at the next available time.  Carto, ICE, anesthesia are requested for the procedure.   -Continue metoprolol  tartrate 25 mg as needed. -Continue Eliquis  5 mg twice daily. -Patient would like to eventually stop Eliquis  d/t concern for side effects. We discussed insertion of loop recorder at time of AFL ablation and if no recurrence or new AF then he can stop his anti-coagulation.   #Hypertension -At goal today.   Recommend checking blood pressures 1-2 times per week at home and recording the values.  Recommend bringing these recordings to the primary care physician.   Follow up with Dr. Daneil Dunker  3 months after ablation.   Signed, Ardeen Kohler, MD

## 2023-10-01 ENCOUNTER — Other Ambulatory Visit: Payer: Self-pay

## 2023-10-01 ENCOUNTER — Encounter: Payer: Self-pay | Admitting: Cardiology

## 2023-10-01 ENCOUNTER — Ambulatory Visit: Attending: Cardiology | Admitting: Cardiology

## 2023-10-01 VITALS — BP 110/66 | HR 57 | Resp 16 | Ht 72.0 in | Wt 169.8 lb

## 2023-10-01 DIAGNOSIS — I483 Typical atrial flutter: Secondary | ICD-10-CM

## 2023-10-01 DIAGNOSIS — D6869 Other thrombophilia: Secondary | ICD-10-CM | POA: Diagnosis not present

## 2023-10-01 DIAGNOSIS — I10 Essential (primary) hypertension: Secondary | ICD-10-CM | POA: Diagnosis not present

## 2023-10-01 DIAGNOSIS — E291 Testicular hypofunction: Secondary | ICD-10-CM | POA: Diagnosis not present

## 2023-10-01 NOTE — Patient Instructions (Addendum)
 Medication Instructions:  Your physician recommends that you continue on your current medications as directed. Please refer to the Current Medication list given to you today.  *If you need a refill on your cardiac medications before your next appointment, please call your pharmacy*  Lab Work: BMET and CBC - you may go to any [facility] location to have these drawn within 30 days of your procedure  Testing/Procedures: Ablation Your physician has recommended that you have an ablation. Catheter ablation is a medical procedure used to treat some cardiac arrhythmias (irregular heartbeats). During catheter ablation, a long, thin, flexible tube is put into a blood vessel in your groin (upper thigh), or neck. This tube is called an ablation catheter. It is then guided to your heart through the blood vessel. Radio frequency waves destroy small areas of heart tissue where abnormal heartbeats may cause an arrhythmia to start. Please see the instruction sheet given to you today.  Follow-Up: At [facility], you and your health needs are our priority.  As part of our continuing mission to provide you with exceptional heart care, we have created designated Provider Care Teams.  These Care Teams include your primary Cardiologist (physician) and Advanced Practice Providers (APPs -  Physician Assistants and Nurse Practitioners) who all work together to provide you with the care you need, when you need it.   Your next appointment:   We will contact you about your post-procedure follow up appointments.

## 2023-10-08 ENCOUNTER — Encounter: Payer: Self-pay | Admitting: Cardiology

## 2023-10-10 DIAGNOSIS — N5201 Erectile dysfunction due to arterial insufficiency: Secondary | ICD-10-CM | POA: Diagnosis not present

## 2023-10-10 DIAGNOSIS — E291 Testicular hypofunction: Secondary | ICD-10-CM | POA: Diagnosis not present

## 2023-10-10 DIAGNOSIS — R972 Elevated prostate specific antigen [PSA]: Secondary | ICD-10-CM | POA: Diagnosis not present

## 2023-10-11 ENCOUNTER — Ambulatory Visit (HOSPITAL_COMMUNITY)
Admission: RE | Admit: 2023-10-11 | Discharge: 2023-10-11 | Disposition: A | Discharge status: Home / Self Care | Source: Ambulatory Visit | Attending: Internal Medicine | Admitting: Internal Medicine

## 2023-10-11 DIAGNOSIS — I483 Typical atrial flutter: Secondary | ICD-10-CM

## 2023-10-12 LAB — ECHOCARDIOGRAM COMPLETE
Area-P 1/2: 2.39 cm2
S' Lateral: 2.9 cm

## 2023-10-13 ENCOUNTER — Ambulatory Visit: Payer: Self-pay | Admitting: Cardiology

## 2023-10-18 DIAGNOSIS — R059 Cough, unspecified: Secondary | ICD-10-CM | POA: Diagnosis not present

## 2023-10-18 DIAGNOSIS — R062 Wheezing: Secondary | ICD-10-CM | POA: Diagnosis not present

## 2023-11-06 ENCOUNTER — Encounter: Payer: Self-pay | Admitting: Cardiology

## 2023-11-11 ENCOUNTER — Encounter: Payer: Self-pay | Admitting: Cardiology

## 2023-11-11 NOTE — Telephone Encounter (Signed)
 Please review and advise.

## 2023-11-12 DIAGNOSIS — I483 Typical atrial flutter: Secondary | ICD-10-CM | POA: Diagnosis not present

## 2023-11-13 ENCOUNTER — Ambulatory Visit: Payer: Self-pay

## 2023-11-13 LAB — BASIC METABOLIC PANEL WITH GFR
BUN/Creatinine Ratio: 11 (ref 10–24)
BUN: 10 mg/dL (ref 8–27)
CO2: 21 mmol/L (ref 20–29)
Calcium: 8.8 mg/dL (ref 8.6–10.2)
Chloride: 97 mmol/L (ref 96–106)
Creatinine, Ser: 0.92 mg/dL (ref 0.76–1.27)
Glucose: 75 mg/dL (ref 70–99)
Potassium: 4.8 mmol/L (ref 3.5–5.2)
Sodium: 132 mmol/L — ABNORMAL LOW (ref 134–144)
eGFR: 86 mL/min/{1.73_m2} (ref >59)

## 2023-11-13 LAB — CBC
Hematocrit: 40.5 % (ref 37.5–51.0)
Hemoglobin: 13.4 g/dL (ref 13.0–17.7)
MCH: 32.4 pg (ref 26.6–33.0)
MCHC: 33.1 g/dL (ref 31.5–35.7)
MCV: 98 fL — ABNORMAL HIGH (ref 79–97)
Platelets: 148 10*3/uL — ABNORMAL LOW (ref 150–450)
RBC: 4.13 x10E6/uL — ABNORMAL LOW (ref 4.14–5.80)
RDW: 13.2 % (ref 11.6–15.4)
WBC: 3.8 10*3/uL (ref 3.4–10.8)

## 2023-11-14 ENCOUNTER — Encounter: Payer: Self-pay | Admitting: Cardiology

## 2023-11-14 DIAGNOSIS — I10 Essential (primary) hypertension: Secondary | ICD-10-CM | POA: Diagnosis not present

## 2023-11-14 NOTE — Pre-Procedure Instructions (Signed)
 Attempted to call patient regarding procedure instructions.  Left voicemail on the following  items: Arrival time 1000 Nothing to eat or drink after midnight No meds AM of procedure Responsible person to drive you home and stay with you for 24 hrs  Have you missed any doses of anti-coagulant Elqiuis- should be take twice a day,  if you have missed any dose please let us  know.  Don't take morning of procedure.

## 2023-11-15 ENCOUNTER — Encounter (HOSPITAL_COMMUNITY)
Admission: RE | Disposition: A | Discharge status: Home / Self Care | Payer: Self-pay | Source: Home / Self Care | Attending: Cardiology

## 2023-11-15 ENCOUNTER — Ambulatory Visit (HOSPITAL_COMMUNITY): Payer: Self-pay | Admitting: Anesthesiology

## 2023-11-15 ENCOUNTER — Ambulatory Visit (HOSPITAL_COMMUNITY)
Admission: RE | Admit: 2023-11-15 | Discharge: 2023-11-15 | Disposition: A | Discharge status: Home / Self Care | Attending: Cardiology | Admitting: Cardiology

## 2023-11-15 ENCOUNTER — Other Ambulatory Visit: Payer: Self-pay

## 2023-11-15 DIAGNOSIS — D6869 Other thrombophilia: Secondary | ICD-10-CM | POA: Diagnosis not present

## 2023-11-15 DIAGNOSIS — I2510 Atherosclerotic heart disease of native coronary artery without angina pectoris: Secondary | ICD-10-CM | POA: Diagnosis not present

## 2023-11-15 DIAGNOSIS — I483 Typical atrial flutter: Secondary | ICD-10-CM | POA: Diagnosis not present

## 2023-11-15 DIAGNOSIS — Z87891 Personal history of nicotine dependence: Secondary | ICD-10-CM | POA: Diagnosis not present

## 2023-11-15 DIAGNOSIS — I10 Essential (primary) hypertension: Secondary | ICD-10-CM | POA: Diagnosis not present

## 2023-11-15 DIAGNOSIS — I4892 Unspecified atrial flutter: Secondary | ICD-10-CM | POA: Diagnosis not present

## 2023-11-15 HISTORY — PX: A-FLUTTER ABLATION: EP1230

## 2023-11-15 HISTORY — PX: LOOP RECORDER INSERTION: EP1214

## 2023-11-15 MED ORDER — SUGAMMADEX SODIUM 200 MG/2ML IV SOLN
INTRAVENOUS | Status: DC | PRN
  Administered 2023-11-15: 160 mg via INTRAVENOUS

## 2023-11-15 MED ORDER — ATROPINE SULFATE 1 MG/10ML IJ SOSY
PREFILLED_SYRINGE | INTRAMUSCULAR | Status: AC
Start: 2023-11-15 — End: 2023-11-15
  Filled 2023-11-15: qty 10

## 2023-11-15 MED ORDER — SODIUM CHLORIDE 0.9% FLUSH
3.0000 mL | INTRAVENOUS | Status: DC | PRN
Start: 2023-11-15 — End: 2023-11-16

## 2023-11-15 MED ORDER — ACETAMINOPHEN 325 MG PO TABS: 650.0000 mg | ORAL_TABLET | ORAL | Status: DC | PRN

## 2023-11-15 MED ORDER — LIDOCAINE-EPINEPHRINE 1 %-1:100000 IJ SOLN
INTRAMUSCULAR | Status: DC | PRN
  Administered 2023-11-15: 20 mL

## 2023-11-15 MED ORDER — APIXABAN 5 MG PO TABS
5.0000 mg | ORAL_TABLET | Freq: Once | ORAL | Status: AC
  Administered 2023-11-15: 5 mg via ORAL
  Filled 2023-11-15: qty 1

## 2023-11-15 MED ORDER — LIDOCAINE 2% (20 MG/ML) 5 ML SYRINGE
INTRAMUSCULAR | Status: DC | PRN
  Administered 2023-11-15: 100 mg via INTRAVENOUS

## 2023-11-15 MED ORDER — FENTANYL CITRATE (PF) 100 MCG/2ML IJ SOLN
INTRAMUSCULAR | Status: AC
Start: 2023-11-15 — End: 2023-11-15
  Filled 2023-11-15: qty 2

## 2023-11-15 MED ORDER — PHENYLEPHRINE 80 MCG/ML (10ML) SYRINGE FOR IV PUSH (FOR BLOOD PRESSURE SUPPORT)
PREFILLED_SYRINGE | INTRAVENOUS | Status: DC | PRN
  Administered 2023-11-15: 160 ug via INTRAVENOUS

## 2023-11-15 MED ORDER — ONDANSETRON HCL 4 MG/2ML IJ SOLN: 4.0000 mg | Freq: Four times a day (QID) | INTRAMUSCULAR | Status: DC | PRN

## 2023-11-15 MED ORDER — EPHEDRINE SULFATE-NACL 50-0.9 MG/10ML-% IV SOSY
PREFILLED_SYRINGE | INTRAVENOUS | Status: DC | PRN
Start: 2023-11-15 — End: 2023-11-15
  Administered 2023-11-15 (×4): 5 mg via INTRAVENOUS

## 2023-11-15 MED ORDER — HEPARIN SODIUM (PORCINE) 1000 UNIT/ML IJ SOLN
INTRAMUSCULAR | Status: AC
  Filled 2023-11-15: qty 10

## 2023-11-15 MED ORDER — DEXAMETHASONE SODIUM PHOSPHATE 10 MG/ML IJ SOLN
INTRAMUSCULAR | Status: DC | PRN
Start: 2023-11-15 — End: 2023-11-15
  Administered 2023-11-15: 5 mg via INTRAVENOUS

## 2023-11-15 MED ORDER — HEPARIN (PORCINE) IN NACL 1000-0.9 UT/500ML-% IV SOLN
INTRAVENOUS | Status: DC | PRN
  Administered 2023-11-15: 500 mL

## 2023-11-15 MED ORDER — FENTANYL CITRATE (PF) 250 MCG/5ML IJ SOLN
INTRAMUSCULAR | Status: DC | PRN
  Administered 2023-11-15: 25 ug via INTRAVENOUS
  Administered 2023-11-15: 50 ug via INTRAVENOUS
  Administered 2023-11-15: 25 ug via INTRAVENOUS

## 2023-11-15 MED ORDER — SODIUM CHLORIDE 0.9 % IV SOLN: 250.0000 mL | INTRAVENOUS | Status: DC | PRN

## 2023-11-15 MED ORDER — ROCURONIUM BROMIDE 10 MG/ML (PF) SYRINGE
PREFILLED_SYRINGE | INTRAVENOUS | Status: DC | PRN
Start: 2023-11-15 — End: 2023-11-15
  Administered 2023-11-15 (×2): 10 mg via INTRAVENOUS
  Administered 2023-11-15: 60 mg via INTRAVENOUS

## 2023-11-15 MED ORDER — SODIUM CHLORIDE 0.9 % IV SOLN: INTRAVENOUS | Status: DC

## 2023-11-15 MED ORDER — ONDANSETRON HCL 4 MG/2ML IJ SOLN
INTRAMUSCULAR | Status: DC | PRN
  Administered 2023-11-15: 4 mg via INTRAVENOUS

## 2023-11-15 MED ORDER — PROPOFOL 10 MG/ML IV BOLUS
INTRAVENOUS | Status: DC | PRN
  Administered 2023-11-15: 200 mg via INTRAVENOUS

## 2023-11-15 MED ORDER — SODIUM CHLORIDE 0.9% FLUSH: 3.0000 mL | Freq: Two times a day (BID) | INTRAVENOUS | Status: DC

## 2023-11-15 NOTE — Transfer of Care (Signed)
 Immediate Anesthesia Transfer of Care Note  Patient: Kenneth Wagner  Procedure(s) Performed: A-FLUTTER ABLATION LOOP RECORDER INSERTION  Patient Location: PACU  Anesthesia Type:General  Level of Consciousness: awake, alert , and oriented  Airway & Oxygen Therapy: Patient Spontanous Breathing and Patient connected to face mask oxygen  Post-op Assessment: Report given to RN and Post -op Vital signs reviewed and stable  Post vital signs: Reviewed and stable  Last Vitals:  Vitals Value Taken Time  BP 113/66 11/15/23 16:15  Temp 36.4 C 11/15/23 16:13  Pulse 81 11/15/23 16:18  Resp 12 11/15/23 16:18  SpO2 98 % 11/15/23 16:18  Vitals shown include unfiled device data.  Last Pain:  Vitals:   11/15/23 1613  TempSrc: Oral  PainSc: 0-No pain         Complications: No notable events documented.

## 2023-11-15 NOTE — H&P (Addendum)
 Electrophysiology Note:   Date:  11/15/23  ID:  Kenneth Wagner, DOB Aug 18, 1945, MRN [id number]   Primary Cardiologist: Wendie Hamburg, MD Electrophysiologist: Ardeen Kohler, MD       History of Present Illness:   Kenneth Wagner is a 78 y.o. male with h/o atrial flutter, hypertension, AAA who is being seen today for evaluation of his atrial flutter at the request of Dr. Alda Amas.    Discussed the use of AI scribe software for clinical note transcription with the patient, who gave verbal consent to proceed.   History of Present Illness He was seen in the ED on 07/31/2023 with atrial flutter.  Underwent DCCV to sinus rhythm.  He reports he was diagnosed with atrial flutter 25 years ago. He had recurrence of atrial flutter in 2024 and again 07/2023. His first 2 episodes resolved spontaneously. On March 5th, he had a severe episode requiring emergency medical intervention, including medication and electrical cardioversion at [facility]. Following this recent episode, he began taking Eliquis . He feels 'spacey' and dizzy, which he suspects may be side effects of the medication. He is concerned about the bleeding risk associated with Eliquis  and wants to discontinue it. He has abstained from alcohol and switched to decaf coffee for the past 60 days, though he occasionally consumes chocolate. He is concerned about the impact of these lifestyle changes on his quality of life. Otherwise doing well.    Interval: Patient presents today for scheduled ablation. Reports feeling relatively well. Missed Eliquis  a couple of weeks ago. In sinus rhythm today. No new or acute complaints.   Review of systems complete and found to be negative unless listed in HPI.    EP Information / Studies Reviewed:     EKG is not ordered today. EKG from 09/06/23 reviewed which showed sinus brady, 48bpm.  PR , QRS 86ms.        EKG 07/31/23: AFL    Echo pending: Scheduled 10/11/23.   Risk  Assessment/Calculations:     CHA2DS2-VASc Score = 4   This indicates a 4.8% annual risk of stroke. The patient's score is based upon: CHF History: 0 HTN History: 1 Diabetes History: 0 Stroke History: 0 Vascular Disease History: 1 Age Score: 2 Gender Score: 0               Physical Exam:    Today's Vitals   11/15/23 1058 11/15/23 1107  BP:  119/84  Pulse:  (!) 52  Resp:  18  Temp:  97.6 F (36.4 C)  TempSrc:  Oral  SpO2:  100%  Weight:  77.1 kg  Height:  6' (1.829 m)  PainSc: 0-No pain    Body mass index is 23.06 kg/m.   GEN: Well nourished, well developed in no acute distress NECK: No JVD CARDIAC: Bradycardic, regular RESPIRATORY:  Clear to auscultation without rales, wheezing or rhonchi  ABDOMEN: Soft, non-distended EXTREMITIES:  No edema; No deformity    ASSESSMENT AND PLAN:     #. Typical appearing atrial flutter:  #. 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  -Discussed treatment options today for AFL including antiarrhythmic drug therapy and ablation. Discussed risks, recovery and likelihood of success with each treatment strategy. Risk, benefits, and alternatives to EP study and ablation for atrial flutter were discussed. These risks include but are not limited to stroke, bleeding, vascular damage, tamponade, perforation, damage to the esophagus, lungs, phrenic nerve and other structures, worsening renal function, coronary vasospasm and  death.  Discussed potential need for repeat ablation procedures and antiarrhythmic drugs after an initial ablation. The patient understands these risk and wishes to proceed today. -Continue metoprolol  tartrate 25 mg as needed. -Continue Eliquis  5 mg twice daily. -Patient would like to eventually stop Eliquis  d/t concern for side effects so we will place loop recorder at time of AFL ablation and if no recurrence or new AF then he can stop his anti-coagulation.      Follow up with Dr. Daneil Dunker 3  months after ablation.     Ardeen Kohler, MD, [facility], [facility] Cardiac Electrophysiology

## 2023-11-15 NOTE — Discharge Instructions (Signed)

## 2023-11-15 NOTE — Anesthesia Procedure Notes (Signed)
 Procedure Name: Intubation Date/Time: 11/15/2023 1:32 PM  Performed by: Tamela Elsayed J, CRNAPre-anesthesia Checklist: Patient identified, Emergency Drugs available, Suction available and Patient being monitored Patient Re-evaluated:Patient Re-evaluated prior to induction Oxygen Delivery Method: Circle System Utilized Preoxygenation: Pre-oxygenation with 100% oxygen Induction Type: IV induction Ventilation: Mask ventilation without difficulty Grade View: Grade II Tube type: Oral Tube size: 7.5 mm Number of attempts: 1 Airway Equipment and Method: Stylet and Oral airway Placement Confirmation: ETT inserted through vocal cords under direct vision, positive ETCO2 and breath sounds checked- equal and bilateral Secured at: 23 cm Tube secured with: Tape Dental Injury: Teeth and Oropharynx as per pre-operative assessment

## 2023-11-15 NOTE — Anesthesia Preprocedure Evaluation (Signed)
 Anesthesia Evaluation  Patient identified by MRN, date of birth, ID band Patient awake    Reviewed: Allergy & Precautions, H&P , NPO status , Patient's Chart, lab work & pertinent test results  Airway Mallampati: I  TM Distance: >3 FB Neck ROM: Full    Dental no notable dental hx.    Pulmonary neg pulmonary ROS, former smoker   Pulmonary exam normal breath sounds clear to auscultation       Cardiovascular hypertension, + CAD  Normal cardiovascular exam Rhythm:Regular Rate:Normal  ECHO 5/25   1. Left ventricular ejection fraction, by estimation, is 60 to 65%. Left  ventricular ejection fraction by 3D volume is 59 %. The left ventricle has  normal function. The left ventricle has no regional wall motion  abnormalities. There is mild left  ventricular hypertrophy. Left ventricular diastolic parameters were  normal. The average left ventricular global longitudinal strain is -19.0  %. The global longitudinal strain is normal.   2. Right ventricular systolic function is normal. The right ventricular  size is normal. There is normal pulmonary artery systolic pressure. The  estimated right ventricular systolic pressure is 23.8 mmHg.   3. Left atrial size was mildly dilated.   4. The mitral valve is grossly normal. Mild mitral valve regurgitation.  No evidence of mitral stenosis.   5. The aortic valve is tricuspid. Aortic valve regurgitation is not  visualized. No aortic stenosis is present.   6. The inferior vena cava is normal in size with greater than 50%  respiratory variability, suggesting right atrial pressure of 3 mmHg.      Neuro/Psych negative neurological ROS  negative psych ROS   GI/Hepatic Neg liver ROS, hiatal hernia,,,  Endo/Other  negative endocrine ROS    Renal/GU negative Renal ROS  negative genitourinary   Musculoskeletal negative musculoskeletal ROS (+) Arthritis ,    Abdominal   Peds negative  pediatric ROS (+)  Hematology negative hematology ROS (+)   Anesthesia Other Findings   Reproductive/Obstetrics negative OB ROS                              Lab Results  Component Value Date   WBC 3.8 11/12/2023   HGB 13.4 11/12/2023   HCT 40.5 11/12/2023   MCV 98 (H) 11/12/2023   PLT 148 (L) 11/12/2023   Lab Results  Component Value Date   CREATININE 0.92 11/12/2023   BUN 10 11/12/2023   NA 132 (L) 11/12/2023   K 4.8 11/12/2023   CL 97 11/12/2023   CO2 21 11/12/2023    Anesthesia Physical Anesthesia Plan  ASA: 3  Anesthesia Plan: General   Post-op Pain Management: Minimal or no pain anticipated   Induction: Intravenous  PONV Risk Score and Plan: 2 and Ondansetron  and Dexamethasone   Airway Management Planned: Oral ETT  Additional Equipment: None  Intra-op Plan:   Post-operative Plan: Extubation in OR  Informed Consent: I have reviewed the patients History and Physical, chart, labs and discussed the procedure including the risks, benefits and alternatives for the proposed anesthesia with the patient or authorized representative who has indicated his/her understanding and acceptance.     Dental advisory given  Plan Discussed with: CRNA and Anesthesiologist  Anesthesia Plan Comments: (  )         Anesthesia Quick Evaluation

## 2023-11-16 ENCOUNTER — Encounter (HOSPITAL_COMMUNITY): Payer: Self-pay | Admitting: Cardiology

## 2023-11-16 NOTE — Anesthesia Postprocedure Evaluation (Signed)
 Anesthesia Post Note  Patient: Kenneth Wagner  Procedure(s) Performed: A-FLUTTER ABLATION LOOP RECORDER INSERTION     Patient location during evaluation: PACU Anesthesia Type: General Level of consciousness: awake and alert Pain management: pain level controlled Vital Signs Assessment: post-procedure vital signs reviewed and stable Respiratory status: spontaneous breathing, nonlabored ventilation, respiratory function stable and patient connected to nasal cannula oxygen Cardiovascular status: blood pressure returned to baseline and stable Postop Assessment: no apparent nausea or vomiting Anesthetic complications: no   No notable events documented.  Last Vitals:  Vitals:   11/15/23 1830 11/15/23 1900  BP: 119/67 116/63  Pulse: 79 75  Resp: 15 13  Temp:    SpO2: 95% 96%    Last Pain:  Vitals:   11/15/23 1710  TempSrc:   PainSc: 0-No pain   Pain Goal:                   [name]

## 2023-11-18 ENCOUNTER — Telehealth (HOSPITAL_COMMUNITY): Payer: Self-pay

## 2023-11-18 NOTE — Telephone Encounter (Signed)
 Spoke with patient to complete post procedure follow up call.   Patient reports no complications with incision site from Implantable loop recorder or groin sites after ablation.   Instructions reviewed with patient:  Advised OK to remove large bandage on chest that he still has in place. Keep the steri-strips underneath in place at chest site. You may shower after 72 hours / 3 days from your procedure with the steri-strips in place. It is normal to have bruising, tenderness, mild swelling, and a pea or marble sized lump/knot at the groin site which can take up to three months to resolve.  Get help right away if you notice sudden swelling at the puncture site.  Check your puncture site every day for signs of infection: fever, redness, swelling, pus drainage, warmth, foul odor or excessive pain. If this occurs, please call the office at [phone], to speak with the nurse. Get help right away if your puncture site is bleeding and the bleeding does not stop after applying firm pressure to the area.  You may continue to have skipped beats/ atrial fibrillation during the first several months after your procedure.  It is very important not to miss any doses of your blood thinner Eliquis .    You will follow up with Dr. Kennyth on 12/17/23.   Patient verbalized understanding to all instructions provided.

## 2023-11-19 ENCOUNTER — Encounter: Payer: Self-pay | Admitting: Cardiology

## 2023-11-19 NOTE — Telephone Encounter (Signed)
 We can reschedule since he will be seeing Dr Kennyth, would plan f/u in 6 months

## 2023-11-20 MED FILL — Fentanyl Citrate Preservative Free (PF) Inj 100 MCG/2ML: INTRAMUSCULAR | Qty: 2 | Status: AC

## 2023-11-20 MED FILL — Atropine Sulfate Soln Prefill Syr 1 MG/10ML (0.1 MG/ML): INTRAMUSCULAR | Qty: 10 | Status: AC

## 2023-11-20 MED FILL — Heparin Sodium (Porcine) Inj 1000 Unit/ML: INTRAMUSCULAR | Qty: 10 | Status: AC

## 2023-11-21 DIAGNOSIS — R972 Elevated prostate specific antigen [PSA]: Secondary | ICD-10-CM | POA: Diagnosis not present

## 2023-11-25 DIAGNOSIS — I10 Essential (primary) hypertension: Secondary | ICD-10-CM | POA: Diagnosis not present

## 2023-11-25 DIAGNOSIS — E7800 Pure hypercholesterolemia, unspecified: Secondary | ICD-10-CM | POA: Diagnosis not present

## 2023-11-26 ENCOUNTER — Encounter: Payer: Self-pay | Admitting: Cardiology

## 2023-12-04 ENCOUNTER — Ambulatory Visit: Admitting: Cardiology

## 2023-12-04 ENCOUNTER — Encounter: Payer: Self-pay | Admitting: Cardiology

## 2023-12-07 ENCOUNTER — Encounter: Payer: Self-pay | Admitting: Cardiology

## 2023-12-11 ENCOUNTER — Encounter: Payer: Self-pay | Admitting: Cardiology

## 2023-12-13 DIAGNOSIS — I10 Essential (primary) hypertension: Secondary | ICD-10-CM | POA: Diagnosis not present

## 2023-12-17 ENCOUNTER — Ambulatory Visit

## 2023-12-17 ENCOUNTER — Ambulatory Visit: Admitting: Cardiology

## 2023-12-17 DIAGNOSIS — I483 Typical atrial flutter: Secondary | ICD-10-CM

## 2023-12-18 ENCOUNTER — Ambulatory Visit: Payer: Self-pay | Admitting: Cardiology

## 2023-12-18 LAB — CUP PACEART REMOTE DEVICE CHECK
Date Time Interrogation Session: 20250722180347
Implantable Pulse Generator Implant Date: 20250620

## 2023-12-23 NOTE — Progress Notes (Unsigned)
 Electrophysiology Office Note:   Date:  12/24/2023  ID:  Kenneth Wagner, DOB 14-May-1946, MRN [id number]  Primary Cardiologist: Lonni LITTIE Nanas, MD Electrophysiologist: Fonda Kitty, MD      History of Present Illness:   Kenneth Wagner is a 78 y.o. male with h/o atrial flutter, hypertension, AAA who was initially seen for evaluation of his atrial flutter at the request of Dr. Nanas. Patient underwent EP study on 11/15/2023.  Typical atrial flutter was induced.  Cavotricuspid isthmus was ablated.  Loop recorder was implanted.  He presents today for follow-up.  Discussed the use of AI scribe software for clinical note transcription with the patient, who gave verbal consent to proceed.  History of Present Illness He is here for a follow-up appointment approximately one month after undergoing an ablation for atrial flutter. He experienced a recent episode of atrial fibrillation lasting about forty minutes, characterized by a racing. This awoke him from sleep. During this episode, he did not take metoprolol . He was able to fall back asleep and when he awoke the episode had resolved. He is currently on Eliquis . Regarding lifestyle factors, he has reduced his intake of alcohol and caffeine, consuming half decaf coffee and having a few beers over the past months. He is planning a trip to [place].  Otherwise doing well with no new or acute complaints today.  Review of systems complete and found to be negative unless listed in HPI.   EP Information / Studies Reviewed:    EKG is ordered today. Personal review as below.  EKG Interpretation Date/Time:  Tuesday December 24 2023 10:29:10 EDT Ventricular Rate:  51 PR Interval:  154 QRS Duration:  86 QT Interval:  422 QTC Calculation: 388 R Axis:   57  Text Interpretation: Sinus bradycardia When compared with ECG of 15-Nov-2023 16:33, Premature atrial complexes are no longer Present QT has shortened Confirmed by Kitty Fonda [phone]) on  12/24/2023 10:04:01 PM   EKG 07/31/23: AFL   Echo pending: Scheduled 10/11/23.  Risk Assessment/Calculations:    CHA2DS2-VASc Score = 4   This indicates a 4.8% annual risk of stroke. The patient's score is based upon: CHF History: 0 HTN History: 1 Diabetes History: 0 Stroke History: 0 Vascular Disease History: 1 Age Score: 2 Gender Score: 0             Physical Exam:   VS:  BP 118/72 (BP Location: Left Arm, Patient Position: Sitting, Cuff Size: Normal)   Pulse (!) 51   Ht 6' (1.829 m)   Wt 169 lb (76.7 kg)   SpO2 97%   BMI 22.92 kg/m    Wt Readings from Last 3 Encounters:  12/24/23 169 lb (76.7 kg)  11/15/23 170 lb (77.1 kg)  10/01/23 169 lb 12.8 oz (77 kg)     GEN: Well nourished, well developed in no acute distress NECK: No JVD CARDIAC: Bradycardic, regular rhythm RESPIRATORY:  Clear to auscultation without rales, wheezing or rhonchi  ABDOMEN: Soft, non-distended EXTREMITIES:  No edema; No deformity   ASSESSMENT AND PLAN:    #. Paroxysmal atrial fibrillation: Detected on ILR, lasting 40 minutes.  #. Typical atrial flutter: S/p CTI ablation on 11/15/23.  No known recurrence of AFL. #. S/p loop recorder: #. 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  - Patient has had 1 known episode of atrial fibrillation following CTI ablation.  Episode lasted 40 minutes.  For now, we will continue to monitor with loop recorder for additional episodes.  If burden increases, then patient is interested in catheter ablation.  He will remain on his Eliquis  for now. -Continue metoprolol  tartrate 25 mg as needed. -Continue Eliquis  5 mg twice daily.  #Hypertension -At goal today.  Recommend checking blood pressures 1-2 times per week at home and recording the values.  Recommend bringing these recordings to the primary care physician.  Follow up with Dr. Kennyth in 3 months.   Signed, Fonda Kennyth, MD

## 2023-12-24 ENCOUNTER — Ambulatory Visit: Attending: Internal Medicine | Admitting: Cardiology

## 2023-12-24 VITALS — BP 118/72 | HR 51 | Ht 72.0 in | Wt 169.0 lb

## 2023-12-24 DIAGNOSIS — I10 Essential (primary) hypertension: Secondary | ICD-10-CM | POA: Diagnosis not present

## 2023-12-24 DIAGNOSIS — Z95818 Presence of other cardiac implants and grafts: Secondary | ICD-10-CM | POA: Diagnosis not present

## 2023-12-24 DIAGNOSIS — I483 Typical atrial flutter: Secondary | ICD-10-CM

## 2023-12-24 DIAGNOSIS — D6869 Other thrombophilia: Secondary | ICD-10-CM | POA: Diagnosis not present

## 2023-12-24 DIAGNOSIS — I480 Paroxysmal atrial fibrillation: Secondary | ICD-10-CM

## 2023-12-24 MED ORDER — APIXABAN 5 MG PO TABS: 5.0000 mg | ORAL_TABLET | Freq: Two times a day (BID) | ORAL | 3 refills | Status: AC

## 2023-12-24 NOTE — Patient Instructions (Signed)
 Medication Instructions:  Your physician recommends that you continue on your current medications as directed. Please refer to the Current Medication list given to you today.  *If you need a refill on your cardiac medications before your next appointment, please call your pharmacy*  Follow-Up: At [facility], you and your health needs are our priority.  As part of our continuing mission to provide you with exceptional heart care, our providers are all part of one team.  This team includes your primary Cardiologist (physician) and Advanced Practice Providers or APPs (Physician Assistants and Nurse Practitioners) who all work together to provide you with the care you need, when you need it.  Your next appointment:   3 months   Provider:   Ardeen Kohler, MD

## 2023-12-26 DIAGNOSIS — I10 Essential (primary) hypertension: Secondary | ICD-10-CM | POA: Diagnosis not present

## 2023-12-26 DIAGNOSIS — E7800 Pure hypercholesterolemia, unspecified: Secondary | ICD-10-CM | POA: Diagnosis not present

## 2024-01-10 ENCOUNTER — Encounter: Payer: Self-pay | Admitting: Cardiology

## 2024-01-17 ENCOUNTER — Ambulatory Visit

## 2024-01-17 DIAGNOSIS — I483 Typical atrial flutter: Secondary | ICD-10-CM | POA: Diagnosis not present

## 2024-01-20 LAB — CUP PACEART REMOTE DEVICE CHECK
Date Time Interrogation Session: 20250822180850
Implantable Pulse Generator Implant Date: 20250620

## 2024-01-26 ENCOUNTER — Ambulatory Visit: Payer: Self-pay | Admitting: Cardiology

## 2024-01-28 DIAGNOSIS — C44319 Basal cell carcinoma of skin of other parts of face: Secondary | ICD-10-CM | POA: Diagnosis not present

## 2024-01-28 DIAGNOSIS — L821 Other seborrheic keratosis: Secondary | ICD-10-CM | POA: Diagnosis not present

## 2024-01-28 DIAGNOSIS — B078 Other viral warts: Secondary | ICD-10-CM | POA: Diagnosis not present

## 2024-02-10 DIAGNOSIS — I10 Essential (primary) hypertension: Secondary | ICD-10-CM | POA: Diagnosis not present

## 2024-02-11 DIAGNOSIS — H6121 Impacted cerumen, right ear: Secondary | ICD-10-CM | POA: Diagnosis not present

## 2024-02-12 NOTE — Progress Notes (Signed)
 Remote Loop Recorder Transmission

## 2024-02-13 DIAGNOSIS — I714 Abdominal aortic aneurysm, without rupture, unspecified: Secondary | ICD-10-CM | POA: Diagnosis not present

## 2024-02-13 DIAGNOSIS — M1990 Unspecified osteoarthritis, unspecified site: Secondary | ICD-10-CM | POA: Diagnosis not present

## 2024-02-13 DIAGNOSIS — N529 Male erectile dysfunction, unspecified: Secondary | ICD-10-CM | POA: Diagnosis not present

## 2024-02-13 DIAGNOSIS — I700 Atherosclerosis of aorta: Secondary | ICD-10-CM | POA: Diagnosis not present

## 2024-02-13 DIAGNOSIS — I10 Essential (primary) hypertension: Secondary | ICD-10-CM | POA: Diagnosis not present

## 2024-02-13 DIAGNOSIS — H9193 Unspecified hearing loss, bilateral: Secondary | ICD-10-CM | POA: Diagnosis not present

## 2024-02-13 DIAGNOSIS — I483 Typical atrial flutter: Secondary | ICD-10-CM | POA: Diagnosis not present

## 2024-02-13 DIAGNOSIS — E785 Hyperlipidemia, unspecified: Secondary | ICD-10-CM | POA: Diagnosis not present

## 2024-02-13 DIAGNOSIS — I2510 Atherosclerotic heart disease of native coronary artery without angina pectoris: Secondary | ICD-10-CM | POA: Diagnosis not present

## 2024-02-13 DIAGNOSIS — E291 Testicular hypofunction: Secondary | ICD-10-CM | POA: Diagnosis not present

## 2024-02-13 DIAGNOSIS — Z87891 Personal history of nicotine dependence: Secondary | ICD-10-CM | POA: Diagnosis not present

## 2024-02-13 DIAGNOSIS — D6869 Other thrombophilia: Secondary | ICD-10-CM | POA: Diagnosis not present

## 2024-02-17 ENCOUNTER — Ambulatory Visit (INDEPENDENT_AMBULATORY_CARE_PROVIDER_SITE_OTHER)

## 2024-02-17 DIAGNOSIS — I483 Typical atrial flutter: Secondary | ICD-10-CM | POA: Diagnosis not present

## 2024-02-18 LAB — CUP PACEART REMOTE DEVICE CHECK
Date Time Interrogation Session: 20250922180311
Implantable Pulse Generator Implant Date: 20250620

## 2024-02-18 NOTE — Progress Notes (Signed)
 Remote Loop Recorder Transmission

## 2024-02-21 DIAGNOSIS — H903 Sensorineural hearing loss, bilateral: Secondary | ICD-10-CM | POA: Diagnosis not present

## 2024-02-21 DIAGNOSIS — R972 Elevated prostate specific antigen [PSA]: Secondary | ICD-10-CM | POA: Diagnosis not present

## 2024-02-22 ENCOUNTER — Ambulatory Visit: Payer: Self-pay | Admitting: Cardiology

## 2024-02-25 DIAGNOSIS — E7800 Pure hypercholesterolemia, unspecified: Secondary | ICD-10-CM | POA: Diagnosis not present

## 2024-02-25 DIAGNOSIS — I10 Essential (primary) hypertension: Secondary | ICD-10-CM | POA: Diagnosis not present

## 2024-02-28 NOTE — Progress Notes (Signed)
 Remote Loop Recorder Transmission

## 2024-03-11 DIAGNOSIS — I10 Essential (primary) hypertension: Secondary | ICD-10-CM | POA: Diagnosis not present

## 2024-03-18 NOTE — Progress Notes (Signed)
 Electrophysiology Office Note:   Date:  03/20/2024  ID:  Kenneth Wagner, DOB 03-Feb-1946, MRN [id number]  Primary Cardiologist: Lonni LITTIE Nanas, MD Electrophysiologist: Fonda Kitty, MD      History of Present Illness:   Kenneth Wagner is a 78 y.o. male with h/o atrial flutter, hypertension, AAA who was initially seen for evaluation of his atrial flutter at the request of Dr. Nanas. Patient underwent EP study on 11/15/2023.  Typical atrial flutter was induced.  Cavotricuspid isthmus was ablated.  Loop recorder was implanted.  He presents today for follow-up.  Discussed the use of AI scribe software for clinical note transcription with the patient, who gave verbal consent to proceed.  History of Present Illness Kenneth Wagner is a 78 year old male with atrial fibrillation who presents for follow-up regarding his AFib episodes.  He has a history of atrial fibrillation and underwent a flutter ablation procedure. A loop recorder was placed to monitor his heart rhythm. Since the placement of the loop recorder, he has experienced two episodes of atrial fibrillation. The first episode lasted 40 minutes and occurred at 3:00 AM. The second episode lasted 3 hours and 16 minutes, starting shortly after midnight.  He is currently on Eliquis  due to the AFib episodes. The episodes have been infrequent and mostly occur during sleep, which he finds less bothersome. He may not always be aware of these episodes, which is why the loop recorder is in place.  He expresses concern about being on Eliquis  due to his tendency to bleed easily, noting that he previously stopped taking aspirin  for this reason. He mentions that he bruises easily, especially at his age, and is cautious about bleeding risks.  He is active and maintains his weight under control. He monitors his blood pressure daily, as encouraged by his PCP.  No new or acute complaints.  Review of systems complete and found to be  negative unless listed in HPI.   EP Information / Studies Reviewed:    EKG is ordered today. Personal review as below.      EKG 07/31/23: AFL   Echo pending: Scheduled 10/11/23.  Risk Assessment/Calculations:    CHA2DS2-VASc Score = 4   This indicates a 4.8% annual risk of stroke. The patient's score is based upon: CHF History: 0 HTN History: 1 Diabetes History: 0 Stroke History: 0 Vascular Disease History: 1 Age Score: 2 Gender Score: 0          Physical Exam:   VS:  BP (!) 151/71   Pulse 61   Ht 6' (1.829 m)   Wt 173 lb (78.5 kg)   SpO2 99%   BMI 23.46 kg/m    Wt Readings from Last 3 Encounters:  03/19/24 173 lb (78.5 kg)  12/24/23 169 lb (76.7 kg)  11/15/23 170 lb (77.1 kg)     GEN: Well nourished, well developed in no acute distress NECK: No JVD CARDIAC: Bradycardic, regular rhythm RESPIRATORY:  Clear to auscultation without rales, wheezing or rhonchi  ABDOMEN: Soft, non-distended EXTREMITIES:  No edema; No deformity   ASSESSMENT AND PLAN:    #. Paroxysmal atrial fibrillation: Symptomatic.  Detected on ILR, longest 3 hours and 16 minutes. #. Typical atrial flutter: S/p CTI ablation on 11/15/23.  No known recurrence of AFL. #. S/p loop recorder: #. 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  - Patient has had 2 known episodes of atrial fibrillation following CTI ablation. I discussed that AF is likely  to continue to recur and may even increase in frequency and duration.  Discussed risks and benefits of antiarrhythmic drug therapy and catheter ablation for treatment of atrial fibrillation.  Patient voiced understanding and will continue to think about his options for now, we will continue to monitor burden with loop recorder device. -Continue metoprolol  tartrate 25 mg as needed. -Continue Eliquis  5 mg twice daily.  He will continue Eliquis  as long as he continues to have episodes of AF.  #Hypertension -At goal today.   Recommend checking blood pressures 1-2 times per week at home and recording the values.  Recommend bringing these recordings to the primary care physician.  Follow up with Dr. Kennyth in 6 months.   Signed, Fonda Kennyth, MD

## 2024-03-19 ENCOUNTER — Ambulatory Visit: Attending: Cardiology | Admitting: Cardiology

## 2024-03-19 ENCOUNTER — Encounter: Payer: Self-pay | Admitting: Cardiology

## 2024-03-19 ENCOUNTER — Ambulatory Visit

## 2024-03-19 VITALS — BP 151/71 | HR 61 | Ht 72.0 in | Wt 173.0 lb

## 2024-03-19 DIAGNOSIS — Z95818 Presence of other cardiac implants and grafts: Secondary | ICD-10-CM | POA: Diagnosis not present

## 2024-03-19 DIAGNOSIS — I480 Paroxysmal atrial fibrillation: Secondary | ICD-10-CM | POA: Diagnosis not present

## 2024-03-19 DIAGNOSIS — I483 Typical atrial flutter: Secondary | ICD-10-CM

## 2024-03-19 DIAGNOSIS — D6869 Other thrombophilia: Secondary | ICD-10-CM | POA: Diagnosis not present

## 2024-03-19 DIAGNOSIS — I10 Essential (primary) hypertension: Secondary | ICD-10-CM | POA: Diagnosis not present

## 2024-03-19 NOTE — Patient Instructions (Signed)
 Medication Instructions:  Your physician recommends that you continue on your current medications as directed. Please refer to the Current Medication list given to you today.  *If you need a refill on your cardiac medications before your next appointment, please call your pharmacy*   Follow-Up: At [facility], you and your health needs are our priority.  As part of our continuing mission to provide you with exceptional heart care, our providers are all part of one team.  This team includes your primary Cardiologist (physician) and Advanced Practice Providers or APPs (Physician Assistants and Nurse Practitioners) who all work together to provide you with the care you need, when you need it.  Your next appointment:   1 year  Provider:   You may see Fonda Kitty, MD or one of the following Advanced Practice Providers on your designated Care Team:   Charlies Arthur, PA-C Michael Andy Tillery, PA-C Suzann Riddle, NP Daphne Barrack, NP Artist Pouch, PA-C

## 2024-03-20 LAB — CUP PACEART REMOTE DEVICE CHECK
Date Time Interrogation Session: 20251023180333
Implantable Pulse Generator Implant Date: 20250620

## 2024-03-21 ENCOUNTER — Ambulatory Visit: Payer: Self-pay | Admitting: Cardiology

## 2024-03-23 NOTE — Progress Notes (Signed)
 Remote Loop Recorder Transmission

## 2024-03-24 DIAGNOSIS — Z08 Encounter for follow-up examination after completed treatment for malignant neoplasm: Secondary | ICD-10-CM | POA: Diagnosis not present

## 2024-03-24 DIAGNOSIS — X32XXXD Exposure to sunlight, subsequent encounter: Secondary | ICD-10-CM | POA: Diagnosis not present

## 2024-03-24 DIAGNOSIS — L570 Actinic keratosis: Secondary | ICD-10-CM | POA: Diagnosis not present

## 2024-03-24 DIAGNOSIS — L218 Other seborrheic dermatitis: Secondary | ICD-10-CM | POA: Diagnosis not present

## 2024-03-24 DIAGNOSIS — Z85828 Personal history of other malignant neoplasm of skin: Secondary | ICD-10-CM | POA: Diagnosis not present

## 2024-03-24 DIAGNOSIS — L820 Inflamed seborrheic keratosis: Secondary | ICD-10-CM | POA: Diagnosis not present

## 2024-03-27 DIAGNOSIS — E7800 Pure hypercholesterolemia, unspecified: Secondary | ICD-10-CM | POA: Diagnosis not present

## 2024-03-27 DIAGNOSIS — I10 Essential (primary) hypertension: Secondary | ICD-10-CM | POA: Diagnosis not present

## 2024-04-09 DIAGNOSIS — R972 Elevated prostate specific antigen [PSA]: Secondary | ICD-10-CM | POA: Diagnosis not present

## 2024-04-16 DIAGNOSIS — N5201 Erectile dysfunction due to arterial insufficiency: Secondary | ICD-10-CM | POA: Diagnosis not present

## 2024-04-16 DIAGNOSIS — E291 Testicular hypofunction: Secondary | ICD-10-CM | POA: Diagnosis not present

## 2024-04-19 ENCOUNTER — Encounter

## 2024-04-22 ENCOUNTER — Ambulatory Visit: Attending: Cardiology

## 2024-04-22 DIAGNOSIS — I483 Typical atrial flutter: Secondary | ICD-10-CM | POA: Diagnosis not present

## 2024-04-24 LAB — CUP PACEART REMOTE DEVICE CHECK
Date Time Interrogation Session: 20251125234020
Implantable Pulse Generator Implant Date: 20250620

## 2024-04-27 ENCOUNTER — Ambulatory Visit: Payer: Self-pay | Admitting: Cardiology

## 2024-04-27 NOTE — Progress Notes (Signed)
 Remote Loop Recorder Transmission

## 2024-04-30 NOTE — Progress Notes (Signed)
 Cardiology Office Note:    Date:  05/01/2024   ID:  Kenneth Wagner, DOB [birth year], MRN [id number]  PCP:  Delayne Artist PARAS, MD  Cardiologist:  Lonni LITTIE Nanas, MD  Electrophysiologist:  Fonda Kitty, MD   Referring MD: Delayne Artist PARAS, MD   Chief Complaint  Patient presents with   Atrial Fibrillation    History of Present Illness:    Kenneth Wagner is a 78 y.o. male with a hx of atrial fibrillation/flutter, hypertension, AAA who presents for follow-up.  He was seen in the ED on 07/31/2023 with new onset atrial flutter.  Underwent DCCV to sinus rhythm.  He was started on Eliquis  and as needed metoprolol .  He reports he was diagnosed with atrial flutter 25 years ago.  He underwent LHC at that time, was told there was no obstructive CAD.  He had recurrence of atrial flutter in 2024 and again 07/2023.  Episode in March 2025 was the first time he had to be cardioverted.    Echocardiogram 10/11/2023 showed EF 60 to 65%, normal diastolic function, normal RV function, mild left atrial enlargement, no significant valvular disease.  Underwent atrial flutter ablation and loop recorder insertion with Dr. Kitty on 11/15/2023.  Found to have paroxysmal atrial fibrillation on his loop recorder.  Since last clinic visit, he reports he is doing well.  Denies any bleeding on Eliquis . Denies any chest pain, dyspnea, lower extremity edema, or palpitations.  Reports some lightheadedness but denies any syncope.  Continues to walk 3 to 5 miles per day, denies any exertional symptoms.   Past Medical History:  Diagnosis Date   Allergy    Arthritis    Coronary artery disease    H/O hiatal hernia    Hypertension    No pertinent past medical history     Past Surgical History:  Procedure Laterality Date   A-FLUTTER ABLATION N/A 11/15/2023   Procedure: A-FLUTTER ABLATION;  Surgeon: Kitty Fonda, MD;  Location: [facility] INVASIVE CV LAB;  Service: Cardiovascular;  Laterality: N/A;   CARDIAC  CATHETERIZATION  09/2010   CARPAL TUNNEL RELEASE  08/13/2011   Procedure: CARPAL TUNNEL RELEASE;  Surgeon: Arley JONELLE Curia, MD;  Location: [facility];  Service: Orthopedics;  Laterality: Left;   COLONOSCOPY     FOREIGN BODY REMOVAL  08/13/2011   Procedure: FOREIGN BODY REMOVAL ADULT;  Surgeon: Arley JONELLE Curia, MD;  Location: [facility];  Service: Orthopedics;  Laterality: Left;  removal foreign body index finger metacarpal phalangeal   JOINT REPLACEMENT  2010   lt mod total knee   JOINT REPLACEMENT  2008   rt total knee   KNEE ARTHROPLASTY     rt and lt as young man   KNEE ARTHROSCOPY     right and left   LOOP RECORDER INSERTION N/A 11/15/2023   Procedure: LOOP RECORDER INSERTION;  Surgeon: Kitty Fonda, MD;  Location: [facility] INVASIVE CV LAB;  Service: Cardiovascular;  Laterality: N/A;   ORIF FINGER FRACTURE  08/2009   rt index   RESECTION DISTAL CLAVICAL Right 02/21/2017   Procedure: DISTAL CLAVICLE EXCISION;  Surgeon: Beverley Toribio FALCON, MD;  Location: [facility];  Service: Orthopedics;  Laterality: Right;   SHOULDER ARTHROSCOPY WITH ROTATOR CUFF REPAIR AND SUBACROMIAL DECOMPRESSION Right 02/21/2017   Procedure: RIGHT SHOULDER ARTHROSCOPY WITH DEBRIDEMENT, ACROMIOPLASTY, ROTATOR CUFF REPAIR;  Surgeon: Beverley Toribio FALCON, MD;  Location: [facility];  Service: Orthopedics;  Laterality: Right;   SHOULDER ARTHROSCOPY WITH SUBACROMIAL  DECOMPRESSION, ROTATOR CUFF REPAIR AND BICEP TENDON REPAIR Left 07/11/2017   Procedure: LEFT SHOULDER ARTHROSCOPY DEBRIDEMENT WITH SUBACROMIAL DECOMPRESSION, ROTATOR CUFF REPAIR AND BICEP TENODESIS;  Surgeon: Cristy Bonner DASEN, MD;  Location: [facility];  Service: Orthopedics;  Laterality: Left;   TONSILLECTOMY     TOTAL HIP ARTHROPLASTY Right 12/12/2016   TOTAL HIP ARTHROPLASTY Right 12/12/2016   Procedure: TOTAL HIP ARTHROPLASTY ANTERIOR APPROACH;  Surgeon: Beverley Toribio FALCON, MD;  Location: [facility] OR;   Service: Orthopedics;  Laterality: Right;    Current Medications: Current Meds  Medication Sig   apixaban  (ELIQUIS ) 5 MG TABS tablet Take 1 tablet (5 mg total) by mouth 2 (two) times daily.   atorvastatin (LIPITOR) 10 MG tablet Take 5 mg by mouth daily.   cholecalciferol (VITAMIN D3) 25 MCG (1000 UNIT) tablet Take 1,000 Units by mouth daily.   EPINEPHrine  0.3 mg/0.3 mL IJ SOAJ injection Inject 0.3 mg into the muscle as needed for anaphylaxis.   losartan (COZAAR) 25 MG tablet Take 12.5 mg by mouth daily.   metoprolol  tartrate (LOPRESSOR ) 25 MG tablet Take 1 tablet (25 mg total) by mouth 3 (three) times daily as needed (HR > 120).   sildenafil (VIAGRA) 100 MG tablet Take 100 mg by mouth daily as needed for erectile dysfunction.   testosterone  cypionate (DEPOTESTOSTERONE CYPIONATE) 200 MG/ML injection Inject 100 mg into the muscle 2 (two) times a week. Sun and Wed     Allergies:   Bee venom, Poison ivy extract, and Lodine [etodolac]   Social History   Socioeconomic History   Marital status: Married    Spouse name: Not on file   Number of children: Not on file   Years of education: Not on file   Highest education level: Not on file  Occupational History   Not on file  Tobacco Use   Smoking status: Former    Current packs/day: 0.00    Average packs/day: 1 pack/day for 5.0 years (5.0 ttl pk-yrs)    Types: Cigarettes    Start date: 08/07/1966    Quit date: 08/07/1971    Years since quitting: 52.7   Smokeless tobacco: Never  Vaping Use   Vaping status: Never Used  Substance and Sexual Activity   Alcohol use: Yes    Alcohol/week: 10.0 standard drinks of alcohol    Types: 10 Glasses of wine per week    Comment: daily   Drug use: No   Sexual activity: Yes    Birth control/protection: None  Other Topics Concern   Not on file  Social History Narrative   Not on file   Social Drivers of Health   Financial Resource Strain: Not on file  Food Insecurity: Low Risk  (05/08/2023)    Received from [facility]   Hunger Vital Sign    Within the past 12 months, you worried that your food would run out before you got money to buy more: Never true    Within the past 12 months, the food you bought just didn't last and you didn't have money to get more. : Never true  Transportation Needs: No Transportation Needs (05/08/2023)   Received from [employer]    In the past 12 months, has lack of reliable transportation kept you from medical appointments, meetings, work or from getting things needed for daily living? : No  Physical Activity: Not on file  Stress: Not on file  Social Connections: Not on file     Family History: The  patient's family history includes Heart attack in his father; Heart disease in his brother; Hyperlipidemia in his brother.  ROS:   Please see the history of present illness.     All other systems reviewed and are negative.  EKGs/Labs/Other Studies Reviewed:    The following studies were reviewed today:   EKG:   09/06/2023: Sinus bradycardia, rate 48, no ST abnormalities  Recent Labs: 07/31/2023: Magnesium  2.0 09/06/2023: ALT 13 11/12/2023: BUN 10; Creatinine, Ser 0.92; Hemoglobin 13.4; Platelets 148; Potassium 4.8; Sodium 132  Recent Lipid Panel No results found for: CHOL, TRIG, HDL, CHOLHDL, VLDL, LDLCALC, LDLDIRECT  Physical Exam:    VS:  BP 114/68   Pulse (!) 55   Ht 6' (1.829 m)   Wt 171 lb (77.6 kg)   SpO2 98%   BMI 23.19 kg/m     Wt Readings from Last 3 Encounters:  05/01/24 171 lb (77.6 kg)  03/19/24 173 lb (78.5 kg)  12/24/23 169 lb (76.7 kg)     GEN:  Well nourished, well developed in no acute distress HEENT: Normal NECK: No JVD; No carotid bruits LYMPHATICS: No lymphadenopathy CARDIAC: RRR, no murmurs, rubs, gallops RESPIRATORY:  Clear to auscultation without rales, wheezing or rhonchi  ABDOMEN: Soft, non-tender, non-distended MUSCULOSKELETAL:  No edema; No deformity  SKIN: Warm and  dry NEUROLOGIC:  Alert and oriented x 3 PSYCHIATRIC:  Normal affect   ASSESSMENT:    1. PAF (paroxysmal atrial fibrillation) (HCC)   2. Snoring   3. Daytime somnolence   4. Essential hypertension   5. Hyperlipidemia, unspecified hyperlipidemia type     PLAN:    Atrial fibrillation/flutter: Diagnosed with atrial flutter 25 years ago, has had 2 episodes in last year, required cardioversion 07/2023 in the ED for atrial flutter.  CHA2DS2-VASc 3 (hypertension, age x 2).  Echocardiogram 10/11/2023 showed EF 60 to 65%, normal diastolic function, normal RV function, mild left atrial enlargement, no significant valvular disease.  Underwent atrial flutter ablation and loop recorder insertion with Dr. Kennyth on 11/15/2023.  Found to have paroxysmal atrial fibrillation on his loop recorder, no further atrial flutter since his ablation - Continue Eliquis  5 mg twice daily.   - Continue metoprolol  25 mg grams as needed - Reports snoring/daytime somnolence, will check sleep study - He is considering atrial fibrillation ablation, will follow-up with Dr. Kennyth  Hypertension: On losartan 12.5 mg daily.  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  Follow-up recommended in 5 years  Hyperlipidemia: On atorvastatin 5 mg daily.  LDL 48 on 06/19/2023  Snoring/daytime somnolence: Check [place] sleep study.  STOP-BANG 5  RTC in 6 months   Medication Adjustments/Labs and Tests Ordered: Current medicines are reviewed at length with the patient today.  Concerns regarding medicines are outlined above.  Orders Placed This Encounter  Procedures   [place] Sleep Study   No orders of the defined types were placed in this encounter.   Patient Instructions  Medication Instructions:  Your physician recommends that you continue on your current medications as directed. Please refer to the Current Medication list given to you today.  *If you  need a refill on your cardiac medications before your next appointment, please call your pharmacy*  Lab Work: None ordered  If you have any lab test that is abnormal or we need to change your treatment, we will call you to review the results.  Testing/Procedures: Your physician has recommended that you have  a home sleep study. This test records several body functions during sleep, including: brain activity, eye movement, oxygen and carbon dioxide blood levels, heart rate and rhythm, breathing rate and rhythm, the flow of air through your mouth and nose, snoring, body muscle movements, and chest and belly movement.  Follow-Up: At [facility], you and your health needs are our priority.  As part of our continuing mission to provide you with exceptional heart care, our providers are all part of one team.  This team includes your primary Cardiologist (physician) and Advanced Practice Providers or APPs (Physician Assistants and Nurse Practitioners) who all work together to provide you with the care you need, when you need it.  Your next appointment:   6 month(s)  Provider:   Lonni LITTIE Nanas, MD    Thank you for choosing [facility]!!   [phone]         Signed, Lonni LITTIE Nanas, MD  05/01/2024 9:13 AM    [facility]

## 2024-05-01 ENCOUNTER — Ambulatory Visit: Attending: Cardiology | Admitting: Cardiology

## 2024-05-01 ENCOUNTER — Encounter: Payer: Self-pay | Admitting: Cardiology

## 2024-05-01 VITALS — BP 114/68 | HR 55 | Ht 72.0 in | Wt 171.0 lb

## 2024-05-01 DIAGNOSIS — I480 Paroxysmal atrial fibrillation: Secondary | ICD-10-CM

## 2024-05-01 DIAGNOSIS — R0683 Snoring: Secondary | ICD-10-CM

## 2024-05-01 DIAGNOSIS — E785 Hyperlipidemia, unspecified: Secondary | ICD-10-CM | POA: Diagnosis not present

## 2024-05-01 DIAGNOSIS — R400 Somnolence: Secondary | ICD-10-CM | POA: Diagnosis not present

## 2024-05-01 DIAGNOSIS — I10 Essential (primary) hypertension: Secondary | ICD-10-CM | POA: Diagnosis not present

## 2024-05-01 NOTE — Patient Instructions (Signed)
 Medication Instructions:  Your physician recommends that you continue on your current medications as directed. Please refer to the Current Medication list given to you today.  *If you need a refill on your cardiac medications before your next appointment, please call your pharmacy*  Lab Work: None ordered  If you have any lab test that is abnormal or we need to change your treatment, we will call you to review the results.  Testing/Procedures: Your physician has recommended that you have a home sleep study. This test records several body functions during sleep, including: brain activity, eye movement, oxygen and carbon dioxide blood levels, heart rate and rhythm, breathing rate and rhythm, the flow of air through your mouth and nose, snoring, body muscle movements, and chest and belly movement.  Follow-Up: At [facility], you and your health needs are our priority.  As part of our continuing mission to provide you with exceptional heart care, our providers are all part of one team.  This team includes your primary Cardiologist (physician) and Advanced Practice Providers or APPs (Physician Assistants and Nurse Practitioners) who all work together to provide you with the care you need, when you need it.  Your next appointment:   6 month(s)  Provider:   Lonni LITTIE Nanas, MD    Thank you for choosing [facility]!!   [phone]

## 2024-05-13 ENCOUNTER — Encounter: Payer: Self-pay | Admitting: Cardiology

## 2024-05-18 ENCOUNTER — Ambulatory Visit

## 2024-05-18 ENCOUNTER — Ambulatory Visit (INDEPENDENT_AMBULATORY_CARE_PROVIDER_SITE_OTHER)

## 2024-05-18 DIAGNOSIS — M62462 Contracture of muscle, left lower leg: Secondary | ICD-10-CM | POA: Diagnosis not present

## 2024-05-18 DIAGNOSIS — M2141 Flat foot [pes planus] (acquired), right foot: Secondary | ICD-10-CM

## 2024-05-18 DIAGNOSIS — M2042 Other hammer toe(s) (acquired), left foot: Secondary | ICD-10-CM

## 2024-05-18 DIAGNOSIS — M2041 Other hammer toe(s) (acquired), right foot: Secondary | ICD-10-CM

## 2024-05-18 DIAGNOSIS — M2142 Flat foot [pes planus] (acquired), left foot: Secondary | ICD-10-CM | POA: Diagnosis not present

## 2024-05-18 DIAGNOSIS — M62461 Contracture of muscle, right lower leg: Secondary | ICD-10-CM | POA: Diagnosis not present

## 2024-05-19 NOTE — Progress Notes (Signed)
 "  Subjective:  Patient ID: Kenneth Wagner, male    DOB: Aug 23, 1945,  MRN: [id number]  Chief Complaint  Patient presents with   Toe Pain    Rm10 Patient complains of pain bilateral 3rd and 4th toes.    Discussed the use of AI scribe software for clinical note transcription with the patient, who gave verbal consent to proceed.  History of Present Illness Kenneth Wagner Kenneth Wagner is a 78 year old male with bilateral pes planus and osteoarthritis who presents for evaluation of bilateral third and fourth toe pain.  Over the past six weeks, he has had intermittent mild discomfort at the distal tips of the third and fourth toes bilaterally, provoked by ambulation and wearing shoes but not limiting walking or daily activities. He notes an altered sensation with palpation of the toe tips, without marked tenderness or pain with movement, including the left fourth toe, and similar symptoms on the right.  He and his wife suspect his toenails may be curling, but he has not observed a clear nail deformity.  He has bilateral pes planus treated with orthotics. He knows he has osteoarthritis of the left first MTP joint but denies pain or stiffness there.     Review of Systems: Negative except as noted in the HPI. Denies N/V/F/Ch.  Past Medical History:  Diagnosis Date   Allergy    Arthritis    Coronary artery disease    H/O hiatal hernia    Hypertension    No pertinent past medical history    Current Medications[1]  Tobacco Use History[2]  Allergies[3] Objective:   Physical Exam EXTREMITIES: Decreased 1st MTP dorsiflexion on the left foot, nonpainful. No pain to palpation of hind foot. Pes planus foot shape.  Mild hammer toe development in third and fourth digits, flexible. Walking on distal tuft of toe. No callus or ulceration development. Strong palpable pedal pulses, neurologically intact. No signs of ingrowing toenails.  Decreased ankle joint dorsiflexion with knee extended, normal  with knee flexed indicating gastrocnemius equinus.  Radiographs: Taken and reviewed.  3 views of bilateral feet were acquired.  The left foot demonstrates decreased joint space with dorsal osteophyte formation of the first metatarsophalangeal joint consistent with hallux limitus.  Pes planus foot shape with decreased calcaneal inclination, increased talar declination and increased talonavicular joint uncoverage.  Hammertoe deformities to 3rd and 4th digits. The right foot demonstrates pes planus foot shape with decreased calcaneal intonation, increased talar declination and increased talonavicular joint uncoverage.  Hammertoe deformities to 3rd and 4th digits.  No acute osseous findings such as fracture or dislocation bilaterally.    Assessment:   1. Hammertoes of both feet   2. Bilateral pes planus   3. Gastrocnemius equinus, bilateral      Plan:  Patient was evaluated and treated and all questions answered.  Assessment and Plan Assessment & Plan Hammer toes of third and fourth digits, bilateral Mild, flexible hammer toe deformities of the third and fourth digits bilaterally due to age-related muscle imbalance. Causes minor, intermittent distal tip pain. No callus, ulceration. - Provided reassurance regarding the benign nature of the condition. - Recommended monitoring for development of callus or wound at the distal tips. - Discussed stretching of the affected toes after showering when skin is most supple. - Advised return for evaluation if symptoms worsen or if callus or wound develops.  Bilateral pes planus Bilateral pes planus with decreased calcaneal inclination, increased talar declination, and early arthritic changes, especially in the left foot.  Asymptomatic with appropriate orthotic use. - Confirmed appropriate use of orthotics for support. - Provided reassurance regarding current management. - Advised monitoring for symptoms or progression.   RTC PRN  Prentice Ovens, DPM  AACFAS Fellowship Trained Podiatric Surgeon [facility]     [1]  Current Outpatient Medications:    apixaban  (ELIQUIS ) 5 MG TABS tablet, Take 1 tablet (5 mg total) by mouth 2 (two) times daily., Disp: 180 tablet, Rfl: 3   atorvastatin (LIPITOR) 10 MG tablet, Take 5 mg by mouth daily., Disp: , Rfl:    cholecalciferol (VITAMIN D3) 25 MCG (1000 UNIT) tablet, Take 1,000 Units by mouth daily., Disp: , Rfl:    EPINEPHrine  0.3 mg/0.3 mL IJ SOAJ injection, Inject 0.3 mg into the muscle as needed for anaphylaxis., Disp: , Rfl:    losartan (COZAAR) 25 MG tablet, Take 12.5 mg by mouth daily., Disp: , Rfl:    metoprolol  tartrate (LOPRESSOR ) 25 MG tablet, Take 1 tablet (25 mg total) by mouth 3 (three) times daily as needed (HR > 120)., Disp: 20 tablet, Rfl: 0   sildenafil (VIAGRA) 100 MG tablet, Take 100 mg by mouth daily as needed for erectile dysfunction., Disp: , Rfl:    testosterone  cypionate (DEPOTESTOSTERONE CYPIONATE) 200 MG/ML injection, Inject 100 mg into the muscle 2 (two) times a week. Sun and Wed, Disp: , Rfl:  [2]  Social History Tobacco Use  Smoking Status Former   Current packs/day: 0.00   Average packs/day: 1 pack/day for 5.0 years (5.0 ttl pk-yrs)   Types: Cigarettes   Start date: 08/07/1966   Quit date: 08/07/1971   Years since quitting: 52.8  Smokeless Tobacco Never  [3]  Allergies Allergen Reactions   Bee Venom Anaphylaxis   Poison Ivy Extract Hives   Lodine [Etodolac] Other (See Comments)    Gi upset   "

## 2024-05-21 ENCOUNTER — Encounter

## 2024-05-23 ENCOUNTER — Ambulatory Visit: Attending: Cardiology

## 2024-05-23 DIAGNOSIS — I480 Paroxysmal atrial fibrillation: Secondary | ICD-10-CM

## 2024-05-25 ENCOUNTER — Encounter: Payer: Self-pay | Admitting: Cardiology

## 2024-05-25 LAB — CUP PACEART REMOTE DEVICE CHECK
Date Time Interrogation Session: 20251226232913
Implantable Pulse Generator Implant Date: 20250620

## 2024-05-30 ENCOUNTER — Ambulatory Visit: Payer: Self-pay | Admitting: Cardiology

## 2024-06-01 NOTE — Progress Notes (Signed)
 Remote Loop Recorder Transmission

## 2024-06-20 ENCOUNTER — Encounter

## 2024-06-23 ENCOUNTER — Ambulatory Visit: Attending: Cardiology

## 2024-06-23 ENCOUNTER — Encounter: Payer: Self-pay | Admitting: Cardiology

## 2024-06-23 DIAGNOSIS — I483 Typical atrial flutter: Secondary | ICD-10-CM

## 2024-06-23 LAB — CUP PACEART REMOTE DEVICE CHECK
Date Time Interrogation Session: 20260126234419
Implantable Pulse Generator Implant Date: 20250620

## 2024-06-28 ENCOUNTER — Ambulatory Visit: Payer: Self-pay | Admitting: Cardiology

## 2024-07-02 NOTE — Progress Notes (Signed)
 Remote Loop Recorder Transmission

## 2024-07-21 ENCOUNTER — Encounter

## 2024-07-24 ENCOUNTER — Ambulatory Visit
